# Patient Record
Sex: Female | Born: 1945 | Race: White | Hispanic: No | Marital: Married | Smoking: Heavy tobacco smoker
Health system: Southern US, Community
[De-identification: ages and names within clinical notes are randomized; demographics above are authoritative.]

## PROBLEM LIST (undated history)

## (undated) DIAGNOSIS — E785 Hyperlipidemia, unspecified: Secondary | ICD-10-CM

## (undated) DIAGNOSIS — Z720 Tobacco use: Secondary | ICD-10-CM

## (undated) DIAGNOSIS — M545 Low back pain, unspecified: Secondary | ICD-10-CM

## (undated) DIAGNOSIS — M5136 Other intervertebral disc degeneration, lumbar region: Secondary | ICD-10-CM

## (undated) DIAGNOSIS — S62109A Fracture of unspecified carpal bone, unspecified wrist, initial encounter for closed fracture: Secondary | ICD-10-CM

## (undated) DIAGNOSIS — I10 Essential (primary) hypertension: Secondary | ICD-10-CM

## (undated) DIAGNOSIS — Z8249 Family history of ischemic heart disease and other diseases of the circulatory system: Secondary | ICD-10-CM

## (undated) DIAGNOSIS — D751 Secondary polycythemia: Secondary | ICD-10-CM

## (undated) DIAGNOSIS — Z8639 Personal history of other endocrine, nutritional and metabolic disease: Secondary | ICD-10-CM

## (undated) DIAGNOSIS — E042 Nontoxic multinodular goiter: Secondary | ICD-10-CM

## (undated) DIAGNOSIS — M25512 Pain in left shoulder: Secondary | ICD-10-CM

## (undated) DIAGNOSIS — J449 Chronic obstructive pulmonary disease, unspecified: Secondary | ICD-10-CM

## (undated) DIAGNOSIS — R911 Solitary pulmonary nodule: Secondary | ICD-10-CM

## (undated) DIAGNOSIS — R928 Other abnormal and inconclusive findings on diagnostic imaging of breast: Principal | ICD-10-CM

## (undated) DIAGNOSIS — G8929 Other chronic pain: Secondary | ICD-10-CM

## (undated) DIAGNOSIS — R7301 Impaired fasting glucose: Secondary | ICD-10-CM

## (undated) DIAGNOSIS — M51369 Other intervertebral disc degeneration, lumbar region without mention of lumbar back pain or lower extremity pain: Secondary | ICD-10-CM

## (undated) DIAGNOSIS — M542 Cervicalgia: Secondary | ICD-10-CM

## (undated) DIAGNOSIS — N63 Unspecified lump in unspecified breast: Secondary | ICD-10-CM

## (undated) DIAGNOSIS — M810 Age-related osteoporosis without current pathological fracture: Secondary | ICD-10-CM

## (undated) HISTORY — PX: HEMORRHOID SURGERY: SHX153

## (undated) HISTORY — DX: Hyperlipidemia, unspecified: E78.5

## (undated) HISTORY — DX: Secondary polycythemia: D75.1

## (undated) HISTORY — DX: Unspecified lump in unspecified breast: N63.0

## (undated) HISTORY — DX: Nontoxic multinodular goiter: E04.2

## (undated) HISTORY — DX: Low back pain, unspecified: M54.50

## (undated) HISTORY — DX: Essential (primary) hypertension: I10

## (undated) HISTORY — DX: Cervicalgia: M54.2

## (undated) HISTORY — PX: ABDOMINAL HYSTERECTOMY: SHX81

## (undated) HISTORY — DX: Other chronic pain: G89.29

## (undated) HISTORY — DX: Family history of ischemic heart disease and other diseases of the circulatory system: Z82.49

## (undated) HISTORY — DX: Impaired fasting glucose: R73.01

## (undated) HISTORY — DX: Solitary pulmonary nodule: R91.1

## (undated) HISTORY — DX: Pain in left shoulder: M25.512

## (undated) HISTORY — DX: Other intervertebral disc degeneration, lumbar region without mention of lumbar back pain or lower extremity pain: M51.369

## (undated) HISTORY — DX: Low back pain: M54.5

## (undated) HISTORY — DX: Fracture of unspecified carpal bone, unspecified wrist, initial encounter for closed fracture: S62.109A

## (undated) HISTORY — DX: Personal history of other endocrine, nutritional and metabolic disease: Z86.39

## (undated) HISTORY — DX: Chronic obstructive pulmonary disease, unspecified: J44.9

## (undated) HISTORY — DX: Other intervertebral disc degeneration, lumbar region: M51.36

---

## 1997-12-29 ENCOUNTER — Emergency Department (HOSPITAL_COMMUNITY): Admission: EM | Admit: 1997-12-29 | Discharge: 1997-12-29 | Payer: Self-pay | Admitting: Emergency Medicine

## 1997-12-29 ENCOUNTER — Encounter: Payer: Self-pay | Admitting: Emergency Medicine

## 2004-11-19 ENCOUNTER — Ambulatory Visit (HOSPITAL_COMMUNITY): Admission: RE | Admit: 2004-11-19 | Discharge: 2004-11-19 | Payer: Self-pay | Admitting: Emergency Medicine

## 2004-12-04 ENCOUNTER — Ambulatory Visit (HOSPITAL_COMMUNITY): Admission: RE | Admit: 2004-12-04 | Discharge: 2004-12-04 | Payer: Self-pay | Admitting: Emergency Medicine

## 2004-12-08 ENCOUNTER — Ambulatory Visit (HOSPITAL_COMMUNITY): Admission: RE | Admit: 2004-12-08 | Discharge: 2004-12-08 | Payer: Self-pay | Admitting: Emergency Medicine

## 2006-02-09 DIAGNOSIS — N63 Unspecified lump in unspecified breast: Secondary | ICD-10-CM

## 2006-02-09 HISTORY — DX: Unspecified lump in unspecified breast: N63.0

## 2006-05-25 ENCOUNTER — Ambulatory Visit (HOSPITAL_COMMUNITY): Admission: RE | Admit: 2006-05-25 | Discharge: 2006-05-25 | Payer: Self-pay | Admitting: Internal Medicine

## 2006-06-23 ENCOUNTER — Ambulatory Visit (HOSPITAL_COMMUNITY): Admission: RE | Admit: 2006-06-23 | Discharge: 2006-06-23 | Payer: Self-pay

## 2007-01-19 ENCOUNTER — Ambulatory Visit (HOSPITAL_COMMUNITY): Admission: RE | Admit: 2007-01-19 | Discharge: 2007-01-19 | Payer: Self-pay | Admitting: Internal Medicine

## 2008-01-26 ENCOUNTER — Ambulatory Visit (HOSPITAL_COMMUNITY): Admission: RE | Admit: 2008-01-26 | Discharge: 2008-01-26 | Payer: Self-pay | Admitting: Internal Medicine

## 2008-02-08 ENCOUNTER — Ambulatory Visit (HOSPITAL_COMMUNITY): Admission: RE | Admit: 2008-02-08 | Discharge: 2008-02-08 | Payer: Self-pay | Admitting: Internal Medicine

## 2009-01-29 ENCOUNTER — Ambulatory Visit (HOSPITAL_COMMUNITY): Admission: RE | Admit: 2009-01-29 | Discharge: 2009-01-29 | Payer: Self-pay | Admitting: Internal Medicine

## 2009-03-28 ENCOUNTER — Emergency Department (HOSPITAL_COMMUNITY): Admission: EM | Admit: 2009-03-28 | Discharge: 2009-03-28 | Payer: Self-pay | Admitting: Emergency Medicine

## 2010-02-09 DIAGNOSIS — S62109A Fracture of unspecified carpal bone, unspecified wrist, initial encounter for closed fracture: Secondary | ICD-10-CM

## 2010-02-09 HISTORY — DX: Fracture of unspecified carpal bone, unspecified wrist, initial encounter for closed fracture: S62.109A

## 2010-05-01 LAB — LIPASE, BLOOD: Lipase: 22 U/L (ref 11–59)

## 2010-05-01 LAB — DIFFERENTIAL
Basophils Relative: 1 % (ref 0–1)
Eosinophils Relative: 1 % (ref 0–5)
Lymphocytes Relative: 25 % (ref 12–46)
Monocytes Relative: 6 % (ref 3–12)
Neutro Abs: 7.7 10*3/uL (ref 1.7–7.7)
Neutrophils Relative %: 68 % (ref 43–77)

## 2010-05-01 LAB — HEPATIC FUNCTION PANEL
ALT: 23 U/L (ref 0–35)
AST: 21 U/L (ref 0–37)
Bilirubin, Direct: 0.1 mg/dL (ref 0.0–0.3)
Total Protein: 7.1 g/dL (ref 6.0–8.3)

## 2010-05-01 LAB — POCT I-STAT, CHEM 8
BUN: 13 mg/dL (ref 6–23)
Chloride: 108 mEq/L (ref 96–112)
Creatinine, Ser: 0.8 mg/dL (ref 0.4–1.2)
HCT: 44 % (ref 36.0–46.0)
Potassium: 3.6 mEq/L (ref 3.5–5.1)
Sodium: 139 mEq/L (ref 135–145)
TCO2: 26 mmol/L (ref 0–100)

## 2010-05-01 LAB — CBC
HCT: 43.2 % (ref 36.0–46.0)
Hemoglobin: 14.9 g/dL (ref 12.0–15.0)
MCHC: 34.5 g/dL (ref 30.0–36.0)
Platelets: 276 10*3/uL (ref 150–400)
RBC: 4.71 MIL/uL (ref 3.87–5.11)

## 2010-05-01 LAB — POCT CARDIAC MARKERS
Myoglobin, poc: 78.5 ng/mL (ref 12–200)
Troponin i, poc: <0.05 ng/mL (ref 0.00–0.09)

## 2010-06-19 ENCOUNTER — Telehealth: Payer: Self-pay | Admitting: Family Medicine

## 2010-06-19 ENCOUNTER — Ambulatory Visit (INDEPENDENT_AMBULATORY_CARE_PROVIDER_SITE_OTHER): Payer: Worker's Compensation | Admitting: Family Medicine

## 2010-06-19 ENCOUNTER — Encounter: Payer: Self-pay | Admitting: Family Medicine

## 2010-06-19 VITALS — BP 143/83 | HR 87 | Temp 98.0°F | Resp 16 | Ht 65.0 in | Wt 177.0 lb

## 2010-06-19 DIAGNOSIS — F17200 Nicotine dependence, unspecified, uncomplicated: Secondary | ICD-10-CM | POA: Insufficient documentation

## 2010-06-19 DIAGNOSIS — E785 Hyperlipidemia, unspecified: Secondary | ICD-10-CM | POA: Insufficient documentation

## 2010-06-19 DIAGNOSIS — J449 Chronic obstructive pulmonary disease, unspecified: Secondary | ICD-10-CM | POA: Insufficient documentation

## 2010-06-19 DIAGNOSIS — I10 Essential (primary) hypertension: Secondary | ICD-10-CM | POA: Insufficient documentation

## 2010-06-19 DIAGNOSIS — S40029A Contusion of unspecified upper arm, initial encounter: Secondary | ICD-10-CM | POA: Insufficient documentation

## 2010-06-19 MED ORDER — OXYCODONE HCL 5 MG PO CAPS: 5.0000 mg | ORAL_CAPSULE | ORAL | Status: AC | PRN

## 2010-06-19 NOTE — Telephone Encounter (Signed)
Pls request old records from Jan Daniels, FNP in [place]. Also from the [facility] in [place], [place].   Thx

## 2010-06-19 NOTE — Assessment & Plan Note (Signed)
Need to r/o radius or carpel bone fx--x-rays ordered today. Placed pt in sling today. Oxycodone 5mg  q4-6h prn pain, #30, no Rf. I encouraged her to come back for appropriate f/u of her chronic medical issues. Will collect old records.

## 2010-06-19 NOTE — Progress Notes (Signed)
Office Note 06/19/2010  CC:  Chief Complaint  Patient presents with  . Arm Pain    fell 5/9,     HPI:  Mckenzie Peters is a 65 y.o. White female who is here to establish care and discuss right arm pain.  Patient's most recent primary MD: Silas Sacramento, FNP in [place].  Also the [facility] in [place], [place]. Old records were reviewed (ones that were already in [facility]) prior to or during today's visit.  Patient presents for acute problem: right arm pain, ongoing/worsening since she slipped and fell off of a chair yesterday when cleaning gutters. Fell onto buttocks, back, and back of head, and hit right arm (?landed on outstretched right arm?).  No longer has any head pain and says back and buttocks are mildly sore, but right arm continues to be 6/10 intensity and hurts worse to move it.  Took one of her husband's vicodin this am and it hasn't helped any.  Soaked in cold water yesterday.  Mild swelling felt by pt in right arm.  No tingling, numbness, or weakness in right arm, wrist, or hand. No history of fracture in the past except clavicle fracture as a child.    Of note, regarding her chronic medical dx's: no meds or o/v's in last 2-3 years due to financial problems/no insurance.  Her income is too much to qualify for the [facility] anymore.  Last saw Silas Sacramento in [place] about a year ago for shingles. She basically takes 1 MVI and some fish oil pills daily.  Past Medical History  Diagnosis Date  . COPD (chronic obstructive pulmonary disease)   . Hypertension   . Hyperlipidemia   . Tobacco dependence 06/19/2010  . Thyroid nodule, cold 2006    Biopsy negative per pt report.  U/s showed multinodular goiter, but TSH nl per pt, thyroid uptake scan nl except cold nodule.  . Breast nodule 2008    Multiple mammos and u/s's done to follow this nonpalpable nodule on right breast: pt reports it eventually resolved.  Screening mammo neg/nl 01/2009.  Marland Kitchen Herpes zoster 2011     Past Surgical History  Procedure Date  . Abdominal hysterectomy approx 1997    Fibroids (ovaries ARE still in)  . Hemorrhoid surgery approx 1982    Family History  Problem Relation Age of Onset  . Cancer Mother 26    Breast.  Died age 32  . Cancer Sister     nephrectomy for cancer  . Cancer Brother     2 brothers died of "cancer"    History   Social History  . Marital Status: Married    Spouse Name: N/A    Number of Children: N/A  . Years of Education: N/A   Occupational History  . Not on file.   Social History Main Topics  . Smoking status: Current Everyday Smoker -- 2.0 packs/day for 30 years    Types: Cigarettes  . Smokeless tobacco: Not on file  . Alcohol Use: Yes     occasional  . Drug Use: Not on file  . Sexually Active: Not on file   Other Topics Concern  . 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  Outpatient Encounter Prescriptions as of 06/19/2010  Medication Sig Dispense Refill  . Multiple Vitamin (MULTIVITAMIN) tablet Take 1 tablet by mouth daily.        . Omega-3 Fatty Acids (FISH OIL) 1200 MG CAPS Take by mouth daily.        Marland Kitchen oxycodone (OXY-IR) 5 MG capsule Take 1 capsule (5 mg total) by mouth every 4 (four) hours as needed for pain.  30 capsule  0  . albuterol-ipratropium (COMBIVENT) 18-103 MCG/ACT inhaler        . lisinopril-hydrochlorothiazide (PRINZIDE,ZESTORETIC) 20-25 MG per tablet Take 1 tablet by mouth daily.        . simvastatin (ZOCOR) 40 MG tablet Take 40 mg by mouth at bedtime.          No Known Allergies  ROS Review of Systems  Constitutional: Negative for fever and fatigue.  HENT: Negative for congestion and  sore throat.   Eyes: Negative for visual disturbance.  Cardiovascular: Negative for chest pain.  Gastrointestinal: Negative for nausea and abdominal pain.  Genitourinary: Negative for dysuria.  Musculoskeletal: Negative for back pain and joint swelling.  Skin: Negative for rash.  Neurological: Negative for weakness and headaches.  Hematological: Negative for adenopathy.     PE; Blood pressure 143/83, pulse 87, temperature 98 F (36.7 C), temperature source Oral, resp. rate 16, height 5\' 5"  (1.651 m), weight 177 lb (80.287 kg), SpO2 94.00%. Gen: Alert, well appearing.  Patient is oriented to person, place, time, and situation. Right arm: no obvious swelling, no erythema, no ecchymoses no deformity.  She can pronate and supinate but slowly and with pain. Elbow and wrist ROM intact.  She has TTP in midshaft of right radius and in distal radius near wrist.  Also TTP in right anatomic snuff box region.  No metacarpal or palmar TTP.  Radial pulse 2+ bilat.  Grip strength normal, proximal right arm strength normal.  Pertinent labs:  none  ASSESSMENT AND PLAN:   Arm contusion Need to r/o radius or carpel bone fx--x-rays ordered today. Placed pt in sling today. Oxycodone 5mg  q4-6h prn pain, #30, no Rf. I encouraged her to come back for appropriate f/u of her chronic medical issues. Will collect old records.     Return if symptoms worsen or fail to improve.

## 2010-06-30 ENCOUNTER — Telehealth: Payer: Self-pay | Admitting: Family Medicine

## 2010-06-30 NOTE — Telephone Encounter (Signed)
I spoke to pt who states she did go to [facility] on [address] as she has filed a Freight forwarder as she states she fell while on work hours.  She is completely out of Oxycodone.   I spoke to [place] at [facility] who states pt was seen there on Friday and is now under their care for her worker's comp claim.  Her xray has not been sent out for over read.  Prelim report says normal.  Pt was not given pain meds at that time.  Pt has a follow up with them on 07/02/10. Should I call pt and advise her that all refills will have to come from them as she is now under their care?

## 2010-06-30 NOTE — Telephone Encounter (Signed)
Advise patient that she will likely have to have these sorts of meds prescribed by the doctor now managing her care for this injury, she is welcome to come in and discuss her ongoing care with her PMD here as soon as tomorrow when he gets back in the office

## 2010-06-30 NOTE — Telephone Encounter (Signed)
[facility], patient is requesting oxycodone refill for pain

## 2010-07-01 NOTE — Telephone Encounter (Signed)
Notified pt of above.  She voices understanding and is agreeable.

## 2010-07-11 ENCOUNTER — Telehealth: Payer: Self-pay | Admitting: Family Medicine

## 2010-07-11 NOTE — Telephone Encounter (Signed)
Left a message with pts spouse to have pt return my call 

## 2010-07-11 NOTE — Telephone Encounter (Signed)
Patient informed. 

## 2010-07-11 NOTE — Telephone Encounter (Signed)
No.  She saw another MD for her arm pain after me (for workers comp purposes per her report), so she needs to continue to get this problem and it's treatment addressed through THAT physician.

## 2010-07-11 NOTE — Telephone Encounter (Signed)
Patient would like Rx for oxycodone, does not want to take Tramadol

## 2010-07-21 NOTE — Telephone Encounter (Signed)
Records have been received.

## 2011-01-15 ENCOUNTER — Encounter: Payer: Self-pay | Admitting: Family Medicine

## 2011-01-15 ENCOUNTER — Ambulatory Visit (INDEPENDENT_AMBULATORY_CARE_PROVIDER_SITE_OTHER): Payer: Self-pay | Admitting: Family Medicine

## 2011-01-15 DIAGNOSIS — E049 Nontoxic goiter, unspecified: Secondary | ICD-10-CM

## 2011-01-15 DIAGNOSIS — F17200 Nicotine dependence, unspecified, uncomplicated: Secondary | ICD-10-CM

## 2011-01-15 DIAGNOSIS — I10 Essential (primary) hypertension: Secondary | ICD-10-CM

## 2011-01-15 LAB — BASIC METABOLIC PANEL
BUN: 11 mg/dL (ref 6–23)
CO2: 25 mEq/L (ref 19–32)
Calcium: 9.7 mg/dL (ref 8.4–10.5)
Chloride: 107 mEq/L (ref 96–112)
Creat: 0.8 mg/dL (ref 0.50–1.10)
Potassium: 3.8 mEq/L (ref 3.5–5.3)

## 2011-01-15 MED ORDER — LISINOPRIL-HYDROCHLOROTHIAZIDE 20-25 MG PO TABS: 1.0000 | ORAL_TABLET | Freq: Every day | ORAL | Status: DC

## 2011-01-15 NOTE — Progress Notes (Signed)
OFFICE NOTE  01/15/2011  CC:  Chief Complaint  Patient presents with  . Hypertension     HPI:   Patient is a 65 y.o. Caucasian female who is here for elevated bp. She reinjured her arm recently (hx of fracture) with repetitive lifting of her wheelchair-bound husband, went to [facility] on [address] yesterday for recheck and they x-rayed it (no new problem) and referred her back to her orthopedist.  She is in pain  (6/7 intensity) with this. Her bp there yesterday was 160/100 and she felt a bit woozy, slight HA since then.  Mild vision blurring.  No focal weakness, dysarthria, or swallowing problems.  NO recent URI or fever.  Has chronic smoker's cough---unchanged. Has been off bp med for a long time due to having no insurance, financial problems, focuses on care of husband more than herself. Does not monitor bp at home except last night it was 168/108.   Denies CP, nausea, arm pain, diaphoresis, palpitations, or leg swelling. No OTC decongestant meds. She does drink about 1 pot of coffee per day and smokes 2 packs of cigarettes daily.  Pertinent PMH:  Past Medical History  Diagnosis Date  . COPD (chronic obstructive pulmonary disease)   . Hypertension   . Hyperlipidemia   . Tobacco dependence 06/19/2010  . Thyroid nodule, cold 2006    Biopsy negative per pt report.  U/s showed multinodular goiter, but TSH nl per pt, thyroid uptake scan nl except cold nodule.  . Breast nodule 2008    Multiple mammos and u/s's done to follow this nonpalpable nodule on right breast: pt reports it eventually resolved.  Screening mammo neg/nl 01/2009.  Marland Kitchen Herpes zoster 2011   Past surgical, family, and social history reviewed and there are no changes since the patient's last office visit with me.  MEDS;  Currently taking only MVI qd, fish oil cap daily, and vit B12 tab daily.  Outpatient Prescriptions Prior to Visit  Medication Sig Dispense Refill  . Multiple Vitamin (MULTIVITAMIN) tablet  Take 1 tablet by mouth daily.        . Omega-3 Fatty Acids (FISH OIL) 1200 MG CAPS Take by mouth daily.        Marland Kitchen albuterol-ipratropium (COMBIVENT) 18-103 MCG/ACT inhaler        . lisinopril-hydrochlorothiazide (PRINZIDE,ZESTORETIC) 20-25 MG per tablet Take 1 tablet by mouth daily.        . simvastatin (ZOCOR) 40 MG tablet Take 40 mg by mouth at bedtime.          PE: Blood pressure 146/92, pulse 92, temperature 97.8 F (36.6 C), temperature source Oral, height 5\' 5"  (1.651 m), weight 173 lb (78.472 kg), SpO2 95.00%. Gen: Alert, well appearing.  Patient is oriented to person, place, time, and situation. NECK: thyroid gland diffusely enlarged, without tenderness or focal nodularity.   Superior to this (overlying crycoid cartilage region) is a soft tissue mass that is soft, rubbery, moveable, directly in midline--mild discomfort with palpation.  No erythema, no warmth, no fluctuance.  There is some normal subQ fat palpable under loosened neck skin here but definitely a palpable soft tissue mass/focal fullness as described. Neuro: CN 2-12 intact bilaterally, strength 5/5 in proximal and distal upper extremities and lower extremities bilaterally.  No sensory deficits.  No tremor.  No disdiadochokinesis.  No ataxia.  Upper extremity and lower extremity DTRs symmetric.  No pronator drift. CV: RRR, no m/r/g.   LUNGS: CTA bilat, nonlabored resps, good aeration in all lung fields.  EXT: no clubbing, cyanosis, or edema.    IMPRESSION AND PLAN:  HTN (hypertension), benign Restart lisinopril/hctz 20/25, 1 qd, #30, RF x 1. Recheck bp daily, goal bp 130/80 or less, return for office recheck in 14mo.  Tobacco dependence Encouraged tobacco cessation but she is not even considering giving up this vice right now.   Thyroid goiter: cold nodule in the past--neg bx. Question of soft tissue neck mass cranial to the thyroid gland region in the midline--pt feels like it is gradually enlarging, has been there for  years. We'll pick up with this problem at our f/u in 14mo.  Checking BMET and TSH today.  FOLLOW UP:  No Follow-up on file.

## 2011-01-15 NOTE — Assessment & Plan Note (Signed)
Restart lisinopril/hctz 20/25, 1 qd, #30, RF x 1. Recheck bp daily, goal bp 130/80 or less, return for office recheck in 37mo.

## 2011-01-15 NOTE — Assessment & Plan Note (Signed)
Encouraged tobacco cessation but she is not even considering giving up this vice right now.

## 2011-01-15 NOTE — Patient Instructions (Signed)
Vitamin K and Warfarin Warfarin is a drug that helps thin your blood. If you take warfarin, you will need to follow a diet that has a consistent amount of vitamin K-containing foods. Sudden changes in the amount of vitamin K that you eat can cause the medicine to not work as well as it should. You do not need to avoid vitamin K-containing foods. FOODS HIGH IN VITAMIN K  Broccoli, fresh or frozen, cooked, 1 cup     Greens, fresh or frozen, cooked (beet, collard, mustard, turnip),  cup     Kale, fresh or frozen, cooked,  cup     Parsley, raw, 10 sprigs     Spinach, frozen or canned, cooked,  cup  FOODS MODERATELY HIGH IN VITAMIN K  Bok choy, cooked, 1 cup     Broccoli, raw, 1 cup     Brussels sprouts, fresh or frozen, cooked,  cup     Cabbage, cooked, 1 cup     Endive, raw, 1 cup     Green leaf lettuce, raw, 1 cup     Green scallions, raw,  cup     Okra, frozen, cooked, 1 cup     Romaine lettuce, raw, 1 cup     Sauerkraut, canned, 1 cup     Spinach, raw, 1 cup  KEEPING YOUR INTAKE CONSISTENT  Note the foods that are high in vitamin K listed above. How many times per week do you eat these foods?     For example, you mighteat cooked broccoli 1 time a week and a leafy green salad 3 times a week. In that case, you should have 1 high vitamin K food each week and 3 moderately high foods each week.     Remember, you do not need to eat the same foods each week. Try to keep your vitamin K intake levels about the same.  MORE TIPS  Take your warfarin as instructed.     It is okay to take a multivitamin that contains vitamin K. Just be sure to take it every day.     Avoid taking herbs unless approved by your caregiver.     Discuss any supplements or whole food supplements with your pharmacist.     If you drink alcohol, drink only in moderation while taking warfarin. If approved by your caregiver, have only 1 to 2 drinks per day. One drink = 5 oz of wine, 12 oz of beer, or  1  oz of hard liquor.  Document Released: 11/23/2008 Document Revised: 10/08/2010 Document Reviewed: 11/23/2008 ExitCare Patient Information 2012 [employer]. 

## 2011-01-15 NOTE — Progress Notes (Signed)
Addended by: Baldemar Lenis R on: 01/15/2011 02:33 PM   Modules accepted: Orders

## 2011-01-22 ENCOUNTER — Other Ambulatory Visit: Payer: Self-pay | Admitting: Family Medicine

## 2011-01-22 ENCOUNTER — Telehealth: Payer: Self-pay | Admitting: *Deleted

## 2011-01-22 DIAGNOSIS — I10 Essential (primary) hypertension: Secondary | ICD-10-CM

## 2011-01-22 NOTE — Telephone Encounter (Signed)
Pt calls stating that she has had back and leg pain/cramping for 2 days.  She would like to know if this could be related to HCTZ.  She has no UTI symptoms.  No other body pains.  She is also taking celebrex and wonders if that could effect anything.  Please adivse.

## 2011-01-22 NOTE — Telephone Encounter (Signed)
Could be related to HCTZ use.  Not likely from celebrex. I recommend she come by the lab for a BMET ASAP (Dx 401.1). Thx

## 2011-01-22 NOTE — Telephone Encounter (Signed)
Left message on machine to return my call. 

## 2011-01-22 NOTE — Telephone Encounter (Signed)
Notified pt. She states she cannot have labs until Monday as her husband has physical therapy tomorrow.

## 2011-01-27 ENCOUNTER — Telehealth: Payer: Self-pay | Admitting: Family Medicine

## 2011-01-27 MED ORDER — AMLODIPINE BESY-BENAZEPRIL HCL 5-20 MG PO CAPS: 1.0000 | ORAL_CAPSULE | Freq: Every day | ORAL | Status: DC

## 2011-01-27 NOTE — Telephone Encounter (Signed)
Pt is eating bananas for potassium and leg pain is better.  Pt would like different med so she doesn't have to eat so many bananas.  Pt can not afford more lab work at this time.  Please advise.

## 2011-01-27 NOTE — Telephone Encounter (Signed)
Patient informed. 

## 2011-01-27 NOTE — Telephone Encounter (Signed)
OK.  D/C current bp med. I sent new rx to [facility] (generic Lotrel: this should not cause low potassium)--PM

## 2011-02-27 ENCOUNTER — Ambulatory Visit: Payer: Self-pay | Admitting: Family Medicine

## 2011-05-26 ENCOUNTER — Telehealth: Payer: Self-pay | Admitting: *Deleted

## 2011-05-26 MED ORDER — DIAZEPAM 10 MG PO TABS: 10.0000 mg | ORAL_TABLET | Freq: Two times a day (BID) | ORAL | Status: AC | PRN

## 2011-05-26 NOTE — Telephone Encounter (Signed)
May call in diazepam 10mg , 1 tab po q12h prn anxiety, #30, no RF.

## 2011-05-26 NOTE — Telephone Encounter (Signed)
Pt calls this AM stating that her husband is on life support following surgery on 05/21/11.  She would like to know if we can call to pharmacy something for her nerves without her being seen.  Pt does admit to taking one of husband's diazepam (which helped symptoms), but does not want to deplete his meds in case he comes home.  Please advise.

## 2011-05-26 NOTE — Telephone Encounter (Signed)
RX faxed

## 2011-07-27 ENCOUNTER — Telehealth: Payer: Self-pay | Admitting: Family Medicine

## 2011-07-27 NOTE — Telephone Encounter (Signed)
Pt is calling for husband.  Follow up ended.

## 2011-10-26 ENCOUNTER — Other Ambulatory Visit: Payer: Self-pay | Admitting: *Deleted

## 2011-10-26 MED ORDER — AMLODIPINE BESY-BENAZEPRIL HCL 5-20 MG PO CAPS: 1.0000 | ORAL_CAPSULE | Freq: Every day | ORAL | Status: DC

## 2011-10-26 NOTE — Telephone Encounter (Signed)
Faxed refill request received from pharmacy for LOTREL Last filled by MD on 01/27/11, #30 X 3 Last seen on 01/15/11 Follow up needed in one month, pt has been taking care of husband.   PC to pt.  Advised I will send in one month supply, but she will need OV prior to any more refills.  She is getting in-home aide for husband and should be able to leave him at home with aide by the time these meds run out.  She will call back to make appt. 30 day supply sent.

## 2012-05-24 ENCOUNTER — Telehealth: Payer: Self-pay | Admitting: *Deleted

## 2012-05-24 NOTE — Telephone Encounter (Signed)
Pt has poison ivy that is getting worse.  She has tried Chief Technology Officer calamine.  Verbal per Dr. Milinda Cave, pt can try oatmeal bath, OTC hydrocortisone cream, and antihistamine such as allegra or zyrtec.  Pt is agreeable.  Advised as long as she has washed the resin off her body well, she will not expose husband.  Advised if she does not get better, she will need office visit.  She is agreeable.

## 2012-05-25 NOTE — Telephone Encounter (Signed)
Agree.-PM

## 2012-11-25 ENCOUNTER — Telehealth: Payer: Self-pay | Admitting: Family Medicine

## 2012-11-25 NOTE — Telephone Encounter (Signed)
Please advise refill? 

## 2012-11-25 NOTE — Telephone Encounter (Signed)
Patient has been getting refills from her [facility] provider however she wants to come back to our office since she now has Medicare.

## 2012-11-25 NOTE — Telephone Encounter (Signed)
Can patient get enough medication to make it to Monday 11/28/12 appt?

## 2012-11-25 NOTE — Telephone Encounter (Signed)
All medications in current med list are OTC.

## 2012-11-28 ENCOUNTER — Ambulatory Visit (INDEPENDENT_AMBULATORY_CARE_PROVIDER_SITE_OTHER): Payer: Medicare Other | Admitting: Family Medicine

## 2012-11-28 ENCOUNTER — Encounter (INDEPENDENT_AMBULATORY_CARE_PROVIDER_SITE_OTHER): Payer: Self-pay

## 2012-11-28 ENCOUNTER — Encounter: Payer: Self-pay | Admitting: Family Medicine

## 2012-11-28 VITALS — BP 124/87 | HR 80 | Temp 98.4°F | Resp 20 | Ht 65.0 in | Wt 170.0 lb

## 2012-11-28 DIAGNOSIS — Z23 Encounter for immunization: Secondary | ICD-10-CM | POA: Diagnosis not present

## 2012-11-28 DIAGNOSIS — G4700 Insomnia, unspecified: Secondary | ICD-10-CM | POA: Insufficient documentation

## 2012-11-28 DIAGNOSIS — M5030 Other cervical disc degeneration, unspecified cervical region: Secondary | ICD-10-CM

## 2012-11-28 DIAGNOSIS — M545 Low back pain: Secondary | ICD-10-CM

## 2012-11-28 DIAGNOSIS — I10 Essential (primary) hypertension: Secondary | ICD-10-CM

## 2012-11-28 DIAGNOSIS — G8929 Other chronic pain: Secondary | ICD-10-CM | POA: Insufficient documentation

## 2012-11-28 DIAGNOSIS — M542 Cervicalgia: Secondary | ICD-10-CM

## 2012-11-28 DIAGNOSIS — F17200 Nicotine dependence, unspecified, uncomplicated: Secondary | ICD-10-CM

## 2012-11-28 LAB — COMPREHENSIVE METABOLIC PANEL
ALT: 33 U/L (ref 0–35)
AST: 24 U/L (ref 0–37)
Albumin: 3.9 g/dL (ref 3.5–5.2)
Alkaline Phosphatase: 97 U/L (ref 39–117)
BUN: 10 mg/dL (ref 6–23)
Calcium: 9.4 mg/dL (ref 8.4–10.5)
Chloride: 102 mEq/L (ref 96–112)
Creatinine, Ser: 0.8 mg/dL (ref 0.4–1.2)
Glucose, Bld: 108 mg/dL — ABNORMAL HIGH (ref 70–99)
Potassium: 4.2 mEq/L (ref 3.5–5.1)
Total Bilirubin: 0.6 mg/dL (ref 0.3–1.2)

## 2012-11-28 MED ORDER — TRAZODONE HCL 50 MG PO TABS: 50.0000 mg | ORAL_TABLET | Freq: Every day | ORAL | Status: DC

## 2012-11-28 MED ORDER — HYDROCHLOROTHIAZIDE 25 MG PO TABS: 25.0000 mg | ORAL_TABLET | Freq: Every day | ORAL | Status: DC

## 2012-11-28 MED ORDER — DIAZEPAM 10 MG PO TABS: 10.0000 mg | ORAL_TABLET | Freq: Every evening | ORAL | Status: DC | PRN

## 2012-11-28 MED ORDER — MELOXICAM 15 MG PO TABS: 15.0000 mg | ORAL_TABLET | Freq: Every day | ORAL | Status: DC

## 2012-11-28 MED ORDER — HYDROCODONE-ACETAMINOPHEN 10-325 MG PO TABS: ORAL_TABLET | ORAL | Status: DC

## 2012-11-28 NOTE — Assessment & Plan Note (Signed)
Successfully quit smoking cigarettes but I encouraged pt to ween herself off of the e-cigs.

## 2012-11-28 NOTE — Progress Notes (Signed)
OFFICE NOTE  11/28/2012  CC:  Chief Complaint  Patient presents with  . Back Pain  . Medication Refill     HPI: Patient is a 67 y.o. Caucasian female who is here to re-establish care with me; coming in for back and neck pain, desires medication refill.  Most recently has been seeing a FNP with [facility] in [facility]).  Also has been seeing someone at [facility] since I saw her last (workman's comp).  Hx of back pain after an accident at work 06/2010.  Plain films showed disc space narrowing per her report.  She initially got better/pain resolved but it returned about 1 yr ago. She was rx'd meloxicam, trial of prednisone, and tramadol.  Says none of it helps.  Also rx'd valium as a muscle relaxer and anxiety med: 5mg  bid.  Says Tylenol #3 and vicodin and she says vicodin helped. Due to insurance/financial problems she has been out of some of her meds for a few months.  Has been taking HCTZ and valium still, plus she apparently has made her tramadol stretch/last for a while b/c she is not out of this med.  Back pain: onset about 11 mo ago when helping her husband with bedpan use/chronic home care, described as "unbearable", low back, right hip, down both buttocks regions to back of thighs but no further, also tingling and numbness in legs--all the way down at times but pt sketchy about the details of this.  Also occ/intermittent lower legs weak.  She was rx'd meloxicam, trial of prednisone, and tramadol.  Says none of it helps.  Also rx'd valium as a muscle relaxer and anxiety med: 5mg  bid.  At one point was rx'd Tylenol #3 and vicodin and she says vicodin helped.  Also, she took one of her husband's oxycodone tabs in the distant past and says it helped the pain everywhere and she could function (with one pill all day long).  PT was recommended and she did "the one visit medicaid allows"--[facility] in [place]. No referral was made to a specialist.  MRI was discussed but pt could  not afford it.  Most recently, she has taken tramadol (2 tabs this morning) and one oxycodone she got from her sister yesterday.  Also takes THREE 5mg  valium qhs--most recently last night.  Neck hurts constantly x 2 yrs--was told an x-ray showed "no cartilage".  Course of treatment was "nothing" b/c workman's comp denied her claim.  Tobacco update: quit cigs 2 yrs ago and is still using e-cigs.  No home bps to report.  Pertinent PMH:  Past Medical History  Diagnosis Date  . COPD (chronic obstructive pulmonary disease)   . Hypertension   . Hyperlipidemia   . Tobacco dependence 06/19/2010  . Thyroid nodule, cold 2006    Biopsy negative per pt report.  U/s showed multinodular goiter, but TSH nl per pt, thyroid uptake scan nl except cold nodule.  . Breast nodule 2008    Multiple mammos and u/s's done to follow this nonpalpable nodule on right breast: pt reports it eventually resolved.  Screening mammo neg/nl 01/2009.  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   Takes care of wheelchair-bound husband, has a dog in the home.   No exercise.   Long smoking history--not contemplating quitting as of 2012.   Financial problems have resulted in fractured/sporadic care  for her chronic medical problems.   ROS: no HA, no CP, no SOB, no palpitations, no tremors, no vision or hearing complaints, no abd pains, no melena or hematochezia.  MEDS:  HCTZ 25mg  qd, tramadol 50 mg, diazepam 5mg  tabs (3 qhs per pt), meloxicam 15mg  qd (not taking).  PE: Blood pressure 124/87, pulse 80, temperature 98.4 F (36.9 C), temperature source Temporal, resp. rate 20, height 5\' 5"  (1.651 m), weight 170 lb (77.111 kg), SpO2 96.00%. Gen: Alert, appears older than stated age, in NAD.  Patient is oriented to person, place, time, and situation. AFFECT: pleasant, lucid thought and speech. CV: RRR, no m/r/g.    LUNGS: CTA bilat, nonlabored resps, good aeration in all lung fields. Neuro: CN 2-12 intact bilaterally, strength 5/5 in proximal and distal upper extremities and lower extremities bilaterally.  No sensory deficits.  No tremor.  No disdiadochokinesis.  No ataxia.  No pronator drift. Neck: diffuse mild TTP in cervical spine and bilat trapezius areas.  Neck ROM intact, mild discomfort with this.  No UE radicular sx's with neck ROM or spurling's maneuver.   Back: lumbar ROM intact with minimal pain/stiffness/discomfort.  Sitting SLR neg--tight hamstrings only.  LAB: none today  IMPRESSION AND PLAN:  Mechanical low back pain Obtain old records/imaging. Refer to PT--[facility] PT order placed today. Discussed restarting meloxicam 15mg  qd and SHORT term vicodin 10/325, 1/2-1 tab q6h prn, #60, no RF. UDS today. If patient's pain appears to require chronic narcotic treatment over the next few months then I will do further imaging and/or refer her to pain specialist.   Chronic neck pain Combo of DDD and myofascial pain.  No sign of radiculopathy. Obtain old records and imaging. PT referral ordered. Meloxicam and vicodin as per "mechanical low back pain" problem above.  HTN (hypertension), benign BP good here today. Need to discuss home monitoring more at future visits. HCTZ refilled.  CMET today.  Insomnia Anxiety/stress related. I recommended she take a max of 10mg  valium at bedtime, so this is what I wrote for her rx for this med today--see orders. Controlled substance contract reviewed with patient today.  Patient signed this and it will be placed in the chart.   UDS today.   Tobacco dependence Successfully quit smoking cigarettes but I encouraged pt to ween herself off of the e-cigs.  Flu vaccine IM today.  Needs pneumovax next f/u visit.  An After Visit Summary was printed and given to the patient.  FOLLOW UP: 3 wks, f/u pain and also f/u her anterior neck mass.

## 2012-11-28 NOTE — Assessment & Plan Note (Addendum)
BP good here today. Need to discuss home monitoring more at future visits. HCTZ refilled.  CMET today.

## 2012-11-28 NOTE — Assessment & Plan Note (Signed)
Combo of DDD and myofascial pain.  No sign of radiculopathy. Obtain old records and imaging. PT referral ordered. Meloxicam and vicodin as per "mechanical low back pain" problem above.

## 2012-11-28 NOTE — Assessment & Plan Note (Signed)
Anxiety/stress related. I recommended she take a max of 10mg  valium at bedtime, so this is what I wrote for her rx for this med today--see orders. Controlled substance contract reviewed with patient today.  Patient signed this and it will be placed in the chart.   UDS today.

## 2012-11-28 NOTE — Assessment & Plan Note (Signed)
Obtain old records/imaging. Refer to PT--[facility] PT order placed today. Discussed restarting meloxicam 15mg  qd and SHORT term vicodin 10/325, 1/2-1 tab q6h prn, #60, no RF. UDS today. If patient's pain appears to require chronic narcotic treatment over the next few months then I will do further imaging and/or refer her to pain specialist.

## 2012-12-02 DIAGNOSIS — M542 Cervicalgia: Secondary | ICD-10-CM | POA: Diagnosis not present

## 2012-12-02 DIAGNOSIS — IMO0001 Reserved for inherently not codable concepts without codable children: Secondary | ICD-10-CM | POA: Diagnosis not present

## 2012-12-02 DIAGNOSIS — M545 Low back pain: Secondary | ICD-10-CM | POA: Diagnosis not present

## 2012-12-10 LAB — HM MAMMOGRAPHY

## 2012-12-21 ENCOUNTER — Encounter: Payer: Self-pay | Admitting: Family Medicine

## 2012-12-21 ENCOUNTER — Ambulatory Visit (INDEPENDENT_AMBULATORY_CARE_PROVIDER_SITE_OTHER): Payer: Medicare Other | Admitting: Family Medicine

## 2012-12-21 VITALS — BP 134/85 | HR 80 | Temp 97.6°F | Resp 18 | Ht 65.0 in | Wt 172.0 lb

## 2012-12-21 DIAGNOSIS — I10 Essential (primary) hypertension: Secondary | ICD-10-CM | POA: Diagnosis not present

## 2012-12-21 DIAGNOSIS — G8929 Other chronic pain: Secondary | ICD-10-CM | POA: Diagnosis not present

## 2012-12-21 DIAGNOSIS — M545 Low back pain: Secondary | ICD-10-CM | POA: Diagnosis not present

## 2012-12-21 DIAGNOSIS — M542 Cervicalgia: Secondary | ICD-10-CM

## 2012-12-21 MED ORDER — OXYCODONE HCL 15 MG PO TABS: ORAL_TABLET | ORAL | Status: DC

## 2012-12-21 NOTE — Progress Notes (Signed)
OFFICE NOTE  12/21/2012  CC:  Chief Complaint  Patient presents with  . Follow-up     HPI: Patient is a 67 y.o. Caucasian female who is here for f/u chronic back and neck pain. Pain same, restarted meloxicam and vicodin but doesn't start PT until this coming Monday. She says she took her brother's 15mg  oxycodone and was able to function all day on only one pill. She's taking bp med. Takes valium prn.  Pertinent PMH:  Past Medical History  Diagnosis Date  . COPD (chronic obstructive pulmonary disease)   . Hypertension   . Hyperlipidemia   . 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 Thyroid nodule, cold 2006    Biopsy negative per pt report.  U/s showed multinodular goiter, but TSH nl per pt, thyroid uptake scan nl except cold nodule.  . Breast nodule 2008    Multiple mammos and u/s's done to follow this nonpalpable nodule on right breast: pt reports it eventually resolved.  Screening mammo neg/nl 01/2009.  Marland Kitchen Herpes zoster 2011  . Wrist fracture, closed 2012    right  . Neck mass 2006  . Chronic low back pain   . Chronic neck pain     MEDS:  Outpatient Prescriptions Prior to Visit  Medication Sig Dispense Refill  . diazepam (VALIUM) 10 MG tablet Take 1 tablet (10 mg total) by mouth at bedtime as needed for anxiety.  30 tablet  0  . hydrochlorothiazide (HYDRODIURIL) 25 MG tablet Take 1 tablet (25 mg total) by mouth daily.  30 tablet  6  . HYDROcodone-acetaminophen (NORCO) 10-325 MG per tablet 1/2-1 tab po q6h prn pain  60 tablet  0  . meloxicam (MOBIC) 15 MG tablet Take 1 tablet (15 mg total) by mouth daily.  30 tablet  6  . Multiple Vitamin (MULTIVITAMIN) tablet Take 1 tablet by mouth daily.        . Omega-3 Fatty Acids (FISH OIL) 1200 MG CAPS Take by mouth daily.        . Vitamin D, Ergocalciferol, (DRISDOL) 50000 UNITS CAPS capsule Take 50,000 Units by mouth every 7 (seven) days.      Marland Kitchen amLODipine-benazepril (LOTREL) 5-20 MG per capsule Take 1 capsule  by mouth daily.  30 capsule  0   No facility-administered medications prior to visit.    PE: Blood pressure 134/85, pulse 80, temperature 97.6 F (36.4 C), temperature source Temporal, resp. rate 18, height 5\' 5"  (1.651 m), weight 172 lb (78.019 kg), SpO2 97.00%. Gen: Alert, well appearing.  Patient is oriented to person, place, time, and situation. AFFECT: pleasant, lucid thought and speech. No further exam today.   IMPRESSION AND PLAN:  1) Chronic neck and back pain. Will do trial of oxycodone 15mg , 1 bid prn, #45. May stop meloxicam.  2) HTN.  The current medical regimen is effective;  continue present plan and medications.   FOLLOW UP: 1 mo

## 2012-12-26 DIAGNOSIS — IMO0001 Reserved for inherently not codable concepts without codable children: Secondary | ICD-10-CM | POA: Diagnosis not present

## 2012-12-26 DIAGNOSIS — M542 Cervicalgia: Secondary | ICD-10-CM | POA: Diagnosis not present

## 2012-12-26 DIAGNOSIS — M545 Low back pain: Secondary | ICD-10-CM | POA: Diagnosis not present

## 2012-12-29 ENCOUNTER — Telehealth: Payer: Self-pay | Admitting: Family Medicine

## 2012-12-29 DIAGNOSIS — IMO0001 Reserved for inherently not codable concepts without codable children: Secondary | ICD-10-CM | POA: Diagnosis not present

## 2012-12-29 DIAGNOSIS — M542 Cervicalgia: Secondary | ICD-10-CM | POA: Diagnosis not present

## 2012-12-29 DIAGNOSIS — M545 Low back pain: Secondary | ICD-10-CM | POA: Diagnosis not present

## 2012-12-29 MED ORDER — DIAZEPAM 10 MG PO TABS: 10.0000 mg | ORAL_TABLET | Freq: Every evening | ORAL | Status: DC | PRN

## 2012-12-29 NOTE — Telephone Encounter (Signed)
Diazepam rx printed.

## 2012-12-29 NOTE — Telephone Encounter (Signed)
Patient requesting refill of diazepam.  Patient last seen 12/21/12.  Rx last printed 11/28/12.  Please advise refill.

## 2012-12-30 NOTE — Telephone Encounter (Signed)
Faxed to pharmacy. Patient aware.  

## 2013-01-09 ENCOUNTER — Encounter: Payer: Self-pay | Admitting: Family Medicine

## 2013-01-11 DIAGNOSIS — M545 Low back pain: Secondary | ICD-10-CM | POA: Diagnosis not present

## 2013-01-11 DIAGNOSIS — IMO0001 Reserved for inherently not codable concepts without codable children: Secondary | ICD-10-CM | POA: Diagnosis not present

## 2013-01-11 DIAGNOSIS — M542 Cervicalgia: Secondary | ICD-10-CM | POA: Diagnosis not present

## 2013-01-13 DIAGNOSIS — IMO0001 Reserved for inherently not codable concepts without codable children: Secondary | ICD-10-CM | POA: Diagnosis not present

## 2013-01-13 DIAGNOSIS — M542 Cervicalgia: Secondary | ICD-10-CM | POA: Diagnosis not present

## 2013-01-13 DIAGNOSIS — M545 Low back pain: Secondary | ICD-10-CM | POA: Diagnosis not present

## 2013-01-18 ENCOUNTER — Ambulatory Visit (INDEPENDENT_AMBULATORY_CARE_PROVIDER_SITE_OTHER): Payer: Medicare Other | Admitting: Family Medicine

## 2013-01-18 ENCOUNTER — Encounter: Payer: Self-pay | Admitting: Family Medicine

## 2013-01-18 VITALS — BP 129/74 | HR 75 | Temp 98.2°F | Resp 18 | Ht 65.0 in | Wt 176.0 lb

## 2013-01-18 DIAGNOSIS — M542 Cervicalgia: Secondary | ICD-10-CM | POA: Diagnosis not present

## 2013-01-18 DIAGNOSIS — M545 Low back pain: Secondary | ICD-10-CM | POA: Diagnosis not present

## 2013-01-18 DIAGNOSIS — Z23 Encounter for immunization: Secondary | ICD-10-CM

## 2013-01-18 DIAGNOSIS — G8929 Other chronic pain: Secondary | ICD-10-CM

## 2013-01-18 MED ORDER — OXYCODONE HCL 15 MG PO TABS: ORAL_TABLET | ORAL | Status: DC

## 2013-01-18 NOTE — Progress Notes (Signed)
OFFICE NOTE  01/18/2013  CC:  Chief Complaint  Patient presents with  . Follow-up     HPI: Patient is a 67 y.o. Caucasian female who is here for 1 mo f/u chronic neck and back pain. Pain well controlled and allows her to function taking one 15mg  oxycodone tab every morning and sometimes a 1/2 tab in evenings. Physical therapy (6 sessions) is helping her low back but not helping her shoulders or neck.  Says she takes valium only when in a lot of stress, not regularly.   Pertinent PMH:  Past Medical History  Diagnosis Date  . COPD (chronic obstructive pulmonary disease)   . Hypertension   . Hyperlipidemia   . 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 Thyroid nodule, cold 2006    Biopsy negative per pt report.  U/s showed multinodular goiter, but TSH nl per pt, thyroid uptake scan nl except cold nodule.  . Breast nodule 2008    Multiple mammos and u/s's done to follow this nonpalpable nodule on right breast: pt reports it eventually resolved.  Screening mammo neg/nl 01/2009.  Marland Kitchen Herpes zoster 2011  . Wrist fracture, closed 2012    right  . Neck mass 2006  . Chronic low back pain   . Chronic neck pain     MEDS:  Outpatient Prescriptions Prior to Visit  Medication Sig Dispense Refill  . diazepam (VALIUM) 10 MG tablet Take 1 tablet (10 mg total) by mouth at bedtime as needed for anxiety.  30 tablet  2  . hydrochlorothiazide (HYDRODIURIL) 25 MG tablet Take 1 tablet (25 mg total) by mouth daily.  30 tablet  6  . Multiple Vitamin (MULTIVITAMIN) tablet Take 1 tablet by mouth daily.        . Omega-3 Fatty Acids (FISH OIL) 1200 MG CAPS Take by mouth daily.        Marland Kitchen oxyCODONE (ROXICODONE) 15 MG immediate release tablet 1 tab po bid prn  45 tablet  0  . Vitamin D, Ergocalciferol, (DRISDOL) 50000 UNITS CAPS capsule Take 50,000 Units by mouth every 7 (seven) days.      Marland Kitchen amLODipine-benazepril (LOTREL) 5-20 MG per capsule Take 1 capsule by mouth daily.  30 capsule  0    No facility-administered medications prior to visit.    PE: Blood pressure 129/74, pulse 75, temperature 98.2 F (36.8 C), temperature source Temporal, resp. rate 18, height 5\' 5"  (1.651 m), weight 176 lb (79.833 kg), SpO2 98.00%. Gen: Alert, well appearing.  Patient is oriented to person, place, time, and situation. WUJ:WJXB: no injection, icteris, swelling, or exudate.  EOMI, PERRLA. Mouth: lips without lesion/swelling.  Oral mucosa pink and moist. Oropharynx without erythema, exudate, or swelling.  Neck - No masses or thyromegaly or limitation in range of motion CV: RRR, no m/r/g.   LUNGS: CTA bilat, nonlabored resps, mildly diminished breath sounds throughout chest.   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 Pain well controlled and she is functioning well taking care of chronically ill husband. UDS last visit had appropriate results. Continue current pain regimen: oxycodone 15mg , 1 tab bid prn, #45 per month.   Rx for this month and then a rx for each of the next 2 months with approp fill on/after dating on rx's.  Pneumovax 23 IM today.  An After Visit Summary was printed and given to the patient.  FOLLOW UP: 4 mo

## 2013-01-19 DIAGNOSIS — M542 Cervicalgia: Secondary | ICD-10-CM | POA: Diagnosis not present

## 2013-01-19 DIAGNOSIS — IMO0001 Reserved for inherently not codable concepts without codable children: Secondary | ICD-10-CM | POA: Diagnosis not present

## 2013-01-19 DIAGNOSIS — M545 Low back pain: Secondary | ICD-10-CM | POA: Diagnosis not present

## 2013-01-24 ENCOUNTER — Ambulatory Visit: Payer: Medicare Other | Admitting: Family Medicine

## 2013-04-14 ENCOUNTER — Telehealth: Payer: Self-pay | Admitting: Family Medicine

## 2013-04-14 NOTE — Telephone Encounter (Signed)
Patient is requesting a refill on Oxycodone, she wants it before the end of the day if possible. Please call patient back either way

## 2013-04-14 NOTE — Telephone Encounter (Signed)
Patient's last visit was on 01/18/13.  Last rx stated refill on or after 03/19/12.  She shouldn't need a refill until 04/18/13.  Please advise.

## 2013-04-17 MED ORDER — OXYCODONE HCL 15 MG PO TABS: ORAL_TABLET | ORAL | Status: DC

## 2013-04-17 NOTE — Telephone Encounter (Signed)
Oxycodone rx printed. 

## 2013-04-17 NOTE — Telephone Encounter (Signed)
LMOM for patient to pick up Rx at front desk.

## 2013-04-24 ENCOUNTER — Encounter: Payer: Self-pay | Admitting: Family Medicine

## 2013-04-24 ENCOUNTER — Ambulatory Visit (INDEPENDENT_AMBULATORY_CARE_PROVIDER_SITE_OTHER): Payer: Medicare Other | Admitting: Family Medicine

## 2013-04-24 VITALS — BP 135/90 | HR 87 | Temp 97.4°F | Resp 18 | Ht 65.0 in | Wt 174.0 lb

## 2013-04-24 DIAGNOSIS — M545 Low back pain, unspecified: Secondary | ICD-10-CM | POA: Diagnosis not present

## 2013-04-24 DIAGNOSIS — G4700 Insomnia, unspecified: Secondary | ICD-10-CM | POA: Diagnosis not present

## 2013-04-24 DIAGNOSIS — F4323 Adjustment disorder with mixed anxiety and depressed mood: Secondary | ICD-10-CM | POA: Diagnosis not present

## 2013-04-24 DIAGNOSIS — F329 Major depressive disorder, single episode, unspecified: Secondary | ICD-10-CM | POA: Insufficient documentation

## 2013-04-24 DIAGNOSIS — F419 Anxiety disorder, unspecified: Secondary | ICD-10-CM

## 2013-04-24 DIAGNOSIS — M5459 Other low back pain: Secondary | ICD-10-CM

## 2013-04-24 MED ORDER — DIAZEPAM 10 MG PO TABS: 10.0000 mg | ORAL_TABLET | Freq: Every evening | ORAL | Status: DC | PRN

## 2013-04-24 MED ORDER — OXYCODONE HCL 15 MG PO TABS: ORAL_TABLET | ORAL | Status: DC

## 2013-04-24 MED ORDER — CITALOPRAM HYDROBROMIDE 20 MG PO TABS: 20.0000 mg | ORAL_TABLET | Freq: Every day | ORAL | Status: DC

## 2013-04-24 NOTE — Progress Notes (Signed)
Pre visit review using our clinic review tool, if applicable. No additional management support is needed unless otherwise documented below in the visit note. 

## 2013-04-24 NOTE — Progress Notes (Signed)
OFFICE NOTE  04/24/2013  CC:  Chief Complaint  Patient presents with  . Follow-up  . Depression     HPI: Patient is a 68 y.o. Caucasian female who is here for 4 mo med questions/follow up. Pt is depressed b/c husband is in ALF now with worsening vascular dementia; "I don't know what to do with myself now". She is tearful today, sobbing.  Crying spells a lot at home lately. Energy is low, hard to get up and even straighten the house up. Appetite down, sleep difficult.  Pain is the same: takes oxycodone the same way she had been before feeling depressed.   Has a sister locally for support.  No hobbies.  No church. Takes her valium avg of 1-2 times per day.  She says she has never been on an antidepressant before. No SI or HI.   No visual or auditory hallucinations, no delusional thinking.  No panic.   Pertinent PMH:  Past Medical History  Diagnosis Date  . COPD (chronic obstructive pulmonary disease)   . Hypertension   . Hyperlipidemia   . 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 Thyroid nodule, cold 2006    Biopsy negative per pt report.  U/s showed multinodular goiter, but TSH nl per pt, thyroid uptake scan nl except cold nodule.  . Breast nodule 2008    Multiple mammos and u/s's done to follow this nonpalpable nodule on right breast: pt reports it eventually resolved.  Screening mammo neg/nl 01/2009.  Marland Kitchen. Herpes zoster 2011  . Wrist fracture, closed 2012    right  . Neck mass 2006  . Chronic low back pain   . Chronic neck pain    Past Surgical History  Procedure Laterality Date  . Abdominal hysterectomy  approx 1997    Fibroids (ovaries ARE still in)  . Hemorrhoid surgery  approx 1982    MEDS:  Outpatient Prescriptions Prior to Visit  Medication Sig Dispense Refill  . diazepam (VALIUM) 10 MG tablet Take 1 tablet (10 mg total) by mouth at bedtime as needed for anxiety.  30 tablet  2  . hydrochlorothiazide (HYDRODIURIL) 25 MG tablet Take 1 tablet (25  mg total) by mouth daily.  30 tablet  6  . Multiple Vitamin (MULTIVITAMIN) tablet Take 1 tablet by mouth daily.        . Omega-3 Fatty Acids (FISH OIL) 1200 MG CAPS Take by mouth daily.        Marland Kitchen. oxyCODONE (ROXICODONE) 15 MG immediate release tablet 1 tab po bid prn  45 tablet  0  . amLODipine-benazepril (LOTREL) 5-20 MG per capsule Take 1 capsule by mouth daily.  30 capsule  0  . Vitamin D, Ergocalciferol, (DRISDOL) 50000 UNITS CAPS capsule Take 50,000 Units by mouth every 7 (seven) days.       No facility-administered medications prior to visit.    PE: Blood pressure 135/90, pulse 87, temperature 97.4 F (36.3 C), temperature source Temporal, resp. rate 18, height 5\' 5"  (1.651 m), weight 174 lb (78.926 kg), SpO2 97.00%. Gen: alert, tearful/crying.  She was able to chuckle and lauqh at herself some.  Lucid thought and speech. CV: RRR, no m/r/g.   LUNGS: CTA bilat, nonlabored resps, good aeration in all lung fields. EXT: no clubbing, cyanosis, or edema.    IMPRESSION AND PLAN:  1) Grieving/depression/adjustment d/o with depression and anxiety features. Start trial of citalopram 20mg  qd.  Continue diazepam 10mg  qhs, #30, RF x 2. Encouraged pt  to talk to sister, establish at a church, restart hobbies, stay active.  2) Chronic LBP: stable. She has rx for this month, recently filled. I printed rx's for oxycodone 15mg  today for April and May 2015.  Appropriate fill on/after date was noted on each rx.  An After Visit Summary was printed and given to the patient.  FOLLOW UP: 3-4 wks

## 2013-04-25 ENCOUNTER — Telehealth: Payer: Self-pay | Admitting: Family Medicine

## 2013-04-25 NOTE — Telephone Encounter (Signed)
Relevant patient education assigned to patient using Emmi. ° °

## 2013-06-13 ENCOUNTER — Ambulatory Visit (INDEPENDENT_AMBULATORY_CARE_PROVIDER_SITE_OTHER): Payer: Medicare Other | Admitting: Family Medicine

## 2013-06-13 ENCOUNTER — Encounter: Payer: Self-pay | Admitting: Family Medicine

## 2013-06-13 VITALS — BP 118/80 | HR 84 | Temp 98.0°F | Resp 18 | Ht 65.0 in | Wt 162.0 lb

## 2013-06-13 DIAGNOSIS — I10 Essential (primary) hypertension: Secondary | ICD-10-CM | POA: Diagnosis not present

## 2013-06-13 DIAGNOSIS — M5459 Other low back pain: Secondary | ICD-10-CM

## 2013-06-13 DIAGNOSIS — G4700 Insomnia, unspecified: Secondary | ICD-10-CM | POA: Diagnosis not present

## 2013-06-13 DIAGNOSIS — M545 Low back pain, unspecified: Secondary | ICD-10-CM | POA: Diagnosis not present

## 2013-06-13 DIAGNOSIS — F4323 Adjustment disorder with mixed anxiety and depressed mood: Secondary | ICD-10-CM | POA: Diagnosis not present

## 2013-06-13 MED ORDER — HYDROCHLOROTHIAZIDE 25 MG PO TABS: 25.0000 mg | ORAL_TABLET | Freq: Every day | ORAL | Status: DC

## 2013-06-13 MED ORDER — OXYCODONE HCL 15 MG PO TABS
ORAL_TABLET | ORAL | Status: DC
Start: 2013-06-13 — End: 2013-06-13

## 2013-06-13 MED ORDER — OXYCODONE HCL 15 MG PO TABS: ORAL_TABLET | ORAL | Status: DC

## 2013-06-13 MED ORDER — DIAZEPAM 10 MG PO TABS: 10.0000 mg | ORAL_TABLET | Freq: Every evening | ORAL | Status: DC | PRN

## 2013-06-13 MED ORDER — CITALOPRAM HYDROBROMIDE 20 MG PO TABS: 20.0000 mg | ORAL_TABLET | Freq: Every day | ORAL | Status: DC
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
## 2013-06-13 NOTE — Progress Notes (Signed)
Pre visit review using our clinic review tool, if applicable. No additional management support is needed unless otherwise documented below in the visit note. 

## 2013-06-13 NOTE — Assessment & Plan Note (Signed)
Mostly anxiety-related lately. Diazepam prn helping some.

## 2013-06-13 NOTE — Assessment & Plan Note (Signed)
The current medical regimen is effective;  continue present plan and medications.  

## 2013-06-13 NOTE — Assessment & Plan Note (Signed)
Doing ok. No med changes.  Push dose to citalopram 20mg  as tolerated, but it looks like all she can tolerate is 10mg  qd at this time. Continue diazepam 10mg  qhs prn, --gave rx for this today, #30, RF x 5.

## 2013-06-13 NOTE — Assessment & Plan Note (Signed)
Has not seen back specialist. At this point will continue with oxycodone to allow for functioning w/out severe pain, will increase monthly allotment to #60.   I printed rx's for oxycodone 15mg  1 bid prn today for this month, June 2015, and July 2015.  Appropriate fill on/after date was noted on each rx.  Called her pharmacy and had them shred the rx for #45 oxycodone that was to be filled in a few days. UDS with approp results in past.  Controlled subst contract in chart. Will hold off on referral to back specialist for now, as she is dealing with a lot with her husband's chronic mental/physical issues.

## 2013-06-14 ENCOUNTER — Telehealth: Payer: Self-pay | Admitting: Family Medicine

## 2013-06-14 NOTE — Telephone Encounter (Signed)
Relevant patient education assigned to patient using Emmi. ° °

## 2013-07-25 ENCOUNTER — Ambulatory Visit (HOSPITAL_BASED_OUTPATIENT_CLINIC_OR_DEPARTMENT_OTHER)
Admission: RE | Admit: 2013-07-25 | Discharge: 2013-07-25 | Disposition: A | Discharge status: Home / Self Care | Payer: Medicare Other | Source: Ambulatory Visit | Attending: Nurse Practitioner | Admitting: Nurse Practitioner

## 2013-07-25 ENCOUNTER — Ambulatory Visit (INDEPENDENT_AMBULATORY_CARE_PROVIDER_SITE_OTHER): Payer: Medicare Other | Admitting: Nurse Practitioner

## 2013-07-25 ENCOUNTER — Telehealth: Payer: Self-pay | Admitting: Nurse Practitioner

## 2013-07-25 ENCOUNTER — Encounter: Payer: Self-pay | Admitting: Nurse Practitioner

## 2013-07-25 DIAGNOSIS — M542 Cervicalgia: Secondary | ICD-10-CM | POA: Insufficient documentation

## 2013-07-25 DIAGNOSIS — M25519 Pain in unspecified shoulder: Secondary | ICD-10-CM | POA: Insufficient documentation

## 2013-07-25 DIAGNOSIS — S0993XA Unspecified injury of face, initial encounter: Secondary | ICD-10-CM | POA: Diagnosis not present

## 2013-07-25 MED ORDER — OXYCODONE HCL 15 MG PO TABS: ORAL_TABLET | ORAL | Status: DC

## 2013-07-25 NOTE — Patient Instructions (Signed)
Get xrays of neck to evaluate for fracture & misalignment.  You definitely have muscle strain. That will take about 2 weeks to improve. Take 600 mg ibuprophen twice daily. You may take with oxycodone. Our office will call with xray results.

## 2013-07-25 NOTE — Telephone Encounter (Signed)
Patient notified of results.
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
## 2013-07-25 NOTE — Progress Notes (Signed)
Pre visit review using our clinic review tool, if applicable. No additional management support is needed unless otherwise documented below in the visit note. 
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
## 2013-08-08 ENCOUNTER — Telehealth: Payer: Self-pay | Admitting: Family Medicine

## 2013-08-08 NOTE — Telephone Encounter (Signed)
Patient called requesting an early refill of her oxycodone because she is trying to go out of town tomorrow.  Patient's pharmacy has the RX they just need authorization to refill early.  Patient's RX is due 08/11/13.  Please advise.

## 2013-08-09 NOTE — Telephone Encounter (Signed)
Discussed with CMA, Faythe GheeLisa Albright by phone 08/08/13.  OK to authorize early RF with pharmacy.

## 2013-09-04 ENCOUNTER — Encounter: Payer: Self-pay | Admitting: Family Medicine

## 2013-09-04 ENCOUNTER — Ambulatory Visit (INDEPENDENT_AMBULATORY_CARE_PROVIDER_SITE_OTHER): Payer: Medicare Other | Admitting: Family Medicine

## 2013-09-04 VITALS — BP 156/87 | HR 88 | Temp 98.2°F | Resp 18 | Ht 65.0 in | Wt 160.0 lb

## 2013-09-04 DIAGNOSIS — F341 Dysthymic disorder: Secondary | ICD-10-CM

## 2013-09-04 DIAGNOSIS — F329 Major depressive disorder, single episode, unspecified: Secondary | ICD-10-CM

## 2013-09-04 DIAGNOSIS — M545 Low back pain, unspecified: Secondary | ICD-10-CM

## 2013-09-04 DIAGNOSIS — G4700 Insomnia, unspecified: Secondary | ICD-10-CM

## 2013-09-04 DIAGNOSIS — M5459 Other low back pain: Secondary | ICD-10-CM

## 2013-09-04 DIAGNOSIS — F419 Anxiety disorder, unspecified: Secondary | ICD-10-CM

## 2013-09-04 DIAGNOSIS — I10 Essential (primary) hypertension: Secondary | ICD-10-CM

## 2013-09-04 DIAGNOSIS — F32A Depression, unspecified: Secondary | ICD-10-CM

## 2013-09-04 MED ORDER — OXYCODONE HCL 15 MG PO TABS: ORAL_TABLET | ORAL | Status: DC

## 2013-09-04 MED ORDER — TRAZODONE HCL 100 MG PO TABS
100.0000 mg | ORAL_TABLET | Freq: Every day | ORAL | Status: DC
Start: 2013-09-04 — End: 2014-05-17

## 2013-09-04 NOTE — Assessment & Plan Note (Signed)
Increase trazodone to 100mg q hs
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
## 2013-09-13 ENCOUNTER — Telehealth: Payer: Self-pay | Admitting: Family Medicine

## 2013-09-13 DIAGNOSIS — K089 Disorder of teeth and supporting structures, unspecified: Secondary | ICD-10-CM

## 2013-09-13 NOTE — Telephone Encounter (Signed)
Please advise 

## 2013-09-13 NOTE — Telephone Encounter (Signed)
Patient is requesting a referral to a dentist that accepts Medicaid that does sedation dentistry. She wants to have 4 teeth removed & wants to get dentures.

## 2013-09-14 NOTE — Telephone Encounter (Signed)
Ordered referral to oral surgeon.  I specified that it needs to be a provider that accepts medicaid.

## 2013-09-25 ENCOUNTER — Other Ambulatory Visit: Payer: Self-pay

## 2013-09-25 ENCOUNTER — Other Ambulatory Visit: Payer: Self-pay | Admitting: Family Medicine

## 2013-09-25 MED ORDER — DIAZEPAM 10 MG PO TABS: 10.0000 mg | ORAL_TABLET | Freq: Two times a day (BID) | ORAL | Status: DC | PRN

## 2013-09-25 NOTE — Telephone Encounter (Signed)
Pls call pt's pharmacy and allow early RF of diazepam.  Pls notify pt that this is the only time I'll do this. I want her to take no more than 1 diazepam in the morning and 1 in the evening.-thx

## 2013-09-25 NOTE — Telephone Encounter (Signed)
Spoke with pt, advised message from Dr. Milinda CaveMcGowen. Pt understood. Faxed in Rx to pharmacy.

## 2013-09-25 NOTE — Telephone Encounter (Signed)
Patient is having dental surgery tomorrow. Between being anxious about the dental surgery tomorrow & spouse's health declining she has had to take 2 tablets in the evening instead of 1. Patient is out of the med. Can she get a refill?

## 2013-09-25 NOTE — Telephone Encounter (Signed)
Refill on diazepam

## 2013-10-03 ENCOUNTER — Telehealth: Payer: Self-pay | Admitting: Family Medicine

## 2013-10-03 NOTE — Telephone Encounter (Signed)
OK to fill the oxycodone rx today. Ask pharmacy if the RF's that they listed are all rx'd by me or were there any other providers who rx'd oxycodone for her?-thx

## 2013-10-03 NOTE — Telephone Encounter (Signed)
Pharm tech states all prescriptions were Rx'd by you except 07/31/13 # 8 was Rx'd by Layne.   Okay'd her early RX.

## 2013-10-03 NOTE — Telephone Encounter (Signed)
Pharmacy tech called stating Pt requesting Oxycodone  IR to be filled today and it states fill on or after 10/04/13.  They also wanted you to be aware of pt's fill history: 07/13/13 # 60 07/31/13 #8 08/08/13 # 60 09/04/13 # 75 Please advise.

## 2013-10-04 NOTE — Telephone Encounter (Signed)
Noted. thx 

## 2013-11-01 ENCOUNTER — Telehealth: Payer: Self-pay | Admitting: Family Medicine

## 2013-11-01 NOTE — Telephone Encounter (Signed)
Please advise 

## 2013-11-01 NOTE — Telephone Encounter (Signed)
Patient has used an extra dose of medication on Sunday due to bringing spouse home from hospital Sunday & having to push his wheelchair, she has re-injuried her back. She is also going to the dentist tomorrow & will need medication for appt. Can patient fill Rx today?

## 2013-11-02 NOTE — Telephone Encounter (Signed)
Spoke with the pharmacist and advised to refill early per Dr. Milinda Cave. Called pt to let her know.

## 2013-11-02 NOTE — Telephone Encounter (Signed)
Pls call pt's pharmacy and authorize oxycodone rx to be filled today (she already has one that should be on hold there that says "fill on/after 11/03/13", so a new rx does not need to be printed).-thx

## 2013-11-15 ENCOUNTER — Ambulatory Visit (INDEPENDENT_AMBULATORY_CARE_PROVIDER_SITE_OTHER): Payer: Medicare Other | Admitting: Family Medicine

## 2013-11-15 ENCOUNTER — Encounter: Payer: Self-pay | Admitting: Family Medicine

## 2013-11-15 VITALS — BP 171/95 | HR 72 | Temp 97.9°F | Resp 18 | Ht 65.0 in | Wt 171.0 lb

## 2013-11-15 DIAGNOSIS — F419 Anxiety disorder, unspecified: Secondary | ICD-10-CM

## 2013-11-15 DIAGNOSIS — F329 Major depressive disorder, single episode, unspecified: Secondary | ICD-10-CM

## 2013-11-15 DIAGNOSIS — M542 Cervicalgia: Secondary | ICD-10-CM | POA: Diagnosis not present

## 2013-11-15 DIAGNOSIS — R635 Abnormal weight gain: Secondary | ICD-10-CM

## 2013-11-15 DIAGNOSIS — I10 Essential (primary) hypertension: Secondary | ICD-10-CM

## 2013-11-15 DIAGNOSIS — Z23 Encounter for immunization: Secondary | ICD-10-CM | POA: Diagnosis not present

## 2013-11-15 DIAGNOSIS — G8929 Other chronic pain: Secondary | ICD-10-CM

## 2013-11-15 LAB — BASIC METABOLIC PANEL
BUN: 10 mg/dL (ref 6–23)
CHLORIDE: 106 meq/L (ref 96–112)
CO2: 23 mEq/L (ref 19–32)
CREATININE: 0.8 mg/dL (ref 0.4–1.2)
Calcium: 9 mg/dL (ref 8.4–10.5)
GFR: 74.12 mL/min (ref >60.00)
GLUCOSE: 83 mg/dL (ref 70–99)
Potassium: 4 mEq/L (ref 3.5–5.1)
Sodium: 139 mEq/L (ref 135–145)

## 2013-11-15 LAB — TSH: TSH: 0.42 u[IU]/mL (ref 0.35–4.50)

## 2013-11-15 MED ORDER — OXYCODONE HCL 15 MG PO TABS: ORAL_TABLET | ORAL | Status: DC

## 2013-11-15 NOTE — Assessment & Plan Note (Signed)
Noncompliant with med lately. Encouraged compliance. Check BMET today.

## 2013-11-15 NOTE — Progress Notes (Signed)
Pre visit review using our clinic review tool, if applicable. No additional management support is needed unless otherwise documented below in the visit note. 

## 2013-11-15 NOTE — Assessment & Plan Note (Signed)
Needs to use NSAIDs more, discussed appropriate use of these. May continue to use oxycodone as per our agreement. Most recent rx filled 11/01/13. I printed rx's for oxycodone 15mg , 1 tab tid prn, #75 today for October and November 2015.  Appropriate fill on/after date was noted on each rx.

## 2013-11-15 NOTE — Progress Notes (Signed)
OFFICE VISIT  11/15/2013   CC:  Chief Complaint  Patient presents with  . Follow-up   HPI:    Patient is a 68 y.o. Caucasian female who presents for 2 and 1/2 mo f/u HTN, chronic LBP, anxiety/depression. Pain "constant" in neck and shoulders.  Left 5th digit stays numb, feels constant "dull" pains down both shoulders and arms.   Says she is taking her bp med regulary but "I missed the last 3 days"-b/c she forgot/was busy. She does no home bp monitoring.  Takes one advil once every 2-3 days.    Mood/anxiety stable on citalopram and diazepam. Wt up lately, hasn't had thyroid screen in a while.  She also requests diabetes screening.  She says she has had no food today but drank a Dr. Reino Kent earlier today.  Past Medical History  Diagnosis Date  . COPD (chronic obstructive pulmonary disease)   . Hypertension   . Hyperlipidemia   . 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 Thyroid nodule, cold 2006    Biopsy negative per pt report.  U/s showed multinodular goiter, but TSH nl per pt, thyroid uptake scan nl except cold nodule.  . Breast nodule 2008    Multiple mammos and u/s's done to follow this nonpalpable nodule on right breast: pt reports it eventually resolved.  Screening mammo neg/nl 01/2009.  Marland Kitchen Herpes zoster 2011  . Wrist fracture, closed 2012    right  . Neck mass 2006  . Chronic low back pain   . Chronic neck pain     Past Surgical History  Procedure Laterality Date  . Abdominal hysterectomy  approx 1997    Fibroids (ovaries ARE still in)  . Hemorrhoid surgery  approx 1982    Outpatient Prescriptions Prior to Visit  Medication Sig Dispense Refill  . citalopram (CELEXA) 20 MG tablet Take 1 tablet (20 mg total) by mouth daily.  30 tablet  3  . diazepam (VALIUM) 10 MG tablet Take 1 tablet (10 mg total) by mouth every 12 (twelve) hours as needed for anxiety.  60 tablet  5  . hydrochlorothiazide (HYDRODIURIL) 25 MG tablet Take 1 tablet (25 mg total) by  mouth daily.  30 tablet  11  . Multiple Vitamin (MULTIVITAMIN) tablet Take 1 tablet by mouth daily.        . Omega-3 Fatty Acids (FISH OIL) 1200 MG CAPS Take by mouth daily.        . traZODone (DESYREL) 100 MG tablet Take 1 tablet (100 mg total) by mouth at bedtime.  30 tablet  6  . Vitamin D, Ergocalciferol, (DRISDOL) 50000 UNITS CAPS capsule Take 50,000 Units by mouth every 7 (seven) days.      Marland Kitchen oxyCODONE (ROXICODONE) 15 MG immediate release tablet 1 tab po tid prn  75 tablet  0   No facility-administered medications prior to visit.    No Known Allergies  ROS As per HPI PE: Blood pressure 171/95, pulse 72, temperature 97.9 F (36.6 C), temperature source Oral, resp. rate 18, height 5\' 5"  (1.651 m), weight 171 lb (77.565 kg), SpO2 97.00%. Gen: Alert, well appearing.  Patient is oriented to person, place, time, and situation. AFFECT: pleasant, lucid thought and speech. CV: RRR, no m/r/g.   LUNGS: CTA bilat, nonlabored resps, good aeration in all lung fields. EXT: no clubbing, cyanosis, or edema.   LABS:  None today  IMPRESSION AND PLAN:  HTN (hypertension), benign Noncompliant with med lately. Encouraged compliance. Check BMET today.  Chronic neck pain Needs to use NSAIDs more, discussed appropriate use of these. May continue to use oxycodone as per our agreement. Most recent rx filled 11/01/13. I printed rx's for oxycodone 15mg , 1 tab tid prn, #75 today for October and November 2015.  Appropriate fill on/after date was noted on each rx.   Anxiety and depression The current medical regimen is effective;  continue present plan and medications. No rf's needed today.   Preventative health care: Prevnar 13 and flu vaccine IM today.  An After Visit Summary was printed and given to the patient.  FOLLOW UP: Return in about 6 months (around 05/17/2014) for routine chronic illness f/u.

## 2013-11-15 NOTE — Assessment & Plan Note (Signed)
The current medical regimen is effective;  continue present plan and medications. No rf's needed today.

## 2013-11-15 NOTE — Addendum Note (Signed)
Addended by: Eulah PontALBRIGHT, LISA M on: 11/15/2013 04:00 PM   Modules accepted: Orders

## 2013-11-23 ENCOUNTER — Other Ambulatory Visit: Payer: Self-pay | Admitting: Family Medicine

## 2013-11-23 DIAGNOSIS — Z1231 Encounter for screening mammogram for malignant neoplasm of breast: Secondary | ICD-10-CM

## 2013-11-27 ENCOUNTER — Ambulatory Visit (HOSPITAL_BASED_OUTPATIENT_CLINIC_OR_DEPARTMENT_OTHER): Payer: Medicare Other

## 2013-11-28 ENCOUNTER — Ambulatory Visit (HOSPITAL_BASED_OUTPATIENT_CLINIC_OR_DEPARTMENT_OTHER): Payer: Medicare Other

## 2013-11-28 ENCOUNTER — Telehealth: Payer: Self-pay | Admitting: Family Medicine

## 2013-11-28 NOTE — Telephone Encounter (Signed)
Pt called requesting rf of her oxycodone early.  Patient is not due for RX until 12/02/13.  Pt has requested early refills the last two months.  I told her that she needs to stop requesting early rfs on this medication which is why she had to sign a controlled substance contract.  Pt states that she doesn't want to go through "with drawl" sxs.   Please advise early refill.  I told pt that Dr. Milinda CaveMcGowen was out of the office the rest of week but he would be trying to check messages.

## 2013-11-30 NOTE — Telephone Encounter (Signed)
Patient notified

## 2013-11-30 NOTE — Telephone Encounter (Signed)
No early RF.

## 2013-12-25 ENCOUNTER — Telehealth: Payer: Self-pay | Admitting: Family Medicine

## 2013-12-25 NOTE — Telephone Encounter (Signed)
Patient states the pharmacy does have her RX.  She will contact us when she needs refill for December.

## 2013-12-25 NOTE — Telephone Encounter (Signed)
Patient aware that she should have another RX for November.  She is going to call her pharmacy to see if they have it.

## 2013-12-25 NOTE — Telephone Encounter (Signed)
Patient is requesting to pick up her 3 month supply Oxycodone paper Rx so she can drop it off at her pharmacy. She said she thinks her refill date is the 6824 or 25th.

## 2014-01-03 ENCOUNTER — Ambulatory Visit: Payer: Medicare Other | Admitting: Family Medicine

## 2014-01-19 ENCOUNTER — Telehealth: Payer: Self-pay | Admitting: Family Medicine

## 2014-01-19 NOTE — Telephone Encounter (Signed)
She is requesting RF too soon. The instructions say "1 three times a day as needed" so this means she has to make the 75 pills last a month. If it said "1 three times a day", then I would dispense #90 and this would have to last her 30 days. Even if I allowed her to pick up rx this coming Monday, this would be 9d too early.  Tell her I will not fill her rx's early like this b/c she has been warned about this several times AND it states this on her controlled substance contract that she signed. I will refer her to a pain management clinic if she wants.  Let me know.-thx

## 2014-01-19 NOTE — Telephone Encounter (Signed)
Dr McGowen please advise. 

## 2014-01-19 NOTE — Telephone Encounter (Signed)
Patient is going to run out of oxycodone soon. Can she pick up her Rx's for the next 3 months. She did say that the way her Rx is written it is only good for 25 days if she takes it 3 x a day.

## 2014-01-23 NOTE — Telephone Encounter (Signed)
Spoke with pt, she states she doesn't need a refill now, she just wants three months so she can keep on file at the pharmacy. Since it is the holidays she is wanted to request them early but also it will save her a trip because she states she is in pain when she drives. She does take three pills a day so she is wondering if she can be prescribed #90 so she doesn't run out. She states you can date the RX's for the time they are due and not early. Please advise.

## 2014-01-24 MED ORDER — OXYCODONE HCL 15 MG PO TABS: ORAL_TABLET | ORAL | Status: DC

## 2014-01-24 MED ORDER — OXYCODONE HCL 15 MG PO TABS
ORAL_TABLET | ORAL | Status: DC
Start: 2014-01-24 — End: 2014-01-24

## 2014-01-24 NOTE — Telephone Encounter (Signed)
OK, oxycodine rx for #90 printed: one for this month, one for Jan 2016, one for Feb 2016.-thx

## 2014-01-25 NOTE — Telephone Encounter (Signed)
Pt aware rx's are at front desk for p/u. 

## 2014-02-21 ENCOUNTER — Telehealth: Payer: Self-pay | Admitting: Family Medicine

## 2014-02-21 NOTE — Telephone Encounter (Signed)
Would like a RX for Radatine 150 mg to be called in./DH

## 2014-02-21 NOTE — Telephone Encounter (Signed)
Ranitidine is not on pt's med list.  Okay to fill?

## 2014-02-21 NOTE — Telephone Encounter (Signed)
Yes, ok to fill ranitidine 150mg , 1 tab po bid, #60, RF x 6.-thx

## 2014-02-22 MED ORDER — RANITIDINE HCL 150 MG PO TABS: 150.0000 mg | ORAL_TABLET | Freq: Two times a day (BID) | ORAL | Status: DC

## 2014-02-22 NOTE — Telephone Encounter (Signed)
Rx sent 

## 2014-02-28 ENCOUNTER — Telehealth: Payer: Self-pay | Admitting: Family Medicine

## 2014-02-28 ENCOUNTER — Telehealth: Payer: Self-pay

## 2014-02-28 NOTE — Telephone Encounter (Signed)
The pharmacist from [facility] called requesting early refill on her Oxycodone medication. Pt is scared she will not be able to get her medication due to the weather we may have. I asked Dr. Milinda CaveMcGowen and he verbalized it was ok to fill the Rx a day early due to the circumstance. I called Angie at [facility] to let her know.

## 2014-02-28 NOTE — Telephone Encounter (Signed)
Yes I took care of this. I spoke with Angie at [facility].

## 2014-02-28 NOTE — Telephone Encounter (Signed)
Patient has Rx at pharmacy but it's dated 03/01/14 for p/u.  OKay to fill today due to weather coming up?

## 2014-02-28 NOTE — Telephone Encounter (Signed)
Patient would like to pick up Rx today due to snow predicted this afternoon. She is not out of medication, she is afraid to wait in case the driving conditions are bad.

## 2014-02-28 NOTE — Telephone Encounter (Signed)
Yes, ok. Delaney Meigsamara, you took care of this didn't you? -thx

## 2014-03-19 ENCOUNTER — Telehealth: Payer: Self-pay | Admitting: Family Medicine

## 2014-03-19 NOTE — Telephone Encounter (Signed)
RF request for valium.  Last OV was 11/15/13.  Last RX printed 09/25/13 x 5 rfs.  Please advise.

## 2014-03-19 NOTE — Telephone Encounter (Signed)
Pt called and needs refill on Diazopam Pharmacy said she neede to call and have a  Refill submitted.

## 2014-04-23 ENCOUNTER — Telehealth: Payer: Self-pay | Admitting: Family Medicine

## 2014-04-23 MED ORDER — OXYCODONE HCL 15 MG PO TABS: ORAL_TABLET | ORAL | Status: DC

## 2014-04-23 NOTE — Telephone Encounter (Signed)
Pt last OV was 11/15/13.  Last Rx stated fill on or after 03/31/14.  Pt isn't due until 04/30/14.  Please advise.

## 2014-04-23 NOTE — Telephone Encounter (Signed)
Rx printed with fill on/after date of 04/28/14.

## 2014-04-23 NOTE — Telephone Encounter (Signed)
Patient needs to pick up Rx for Oxycodone. Please call when ready.

## 2014-04-24 NOTE — Telephone Encounter (Signed)
Patient notified

## 2014-05-17 ENCOUNTER — Encounter: Payer: Self-pay | Admitting: Family Medicine

## 2014-05-17 ENCOUNTER — Ambulatory Visit (INDEPENDENT_AMBULATORY_CARE_PROVIDER_SITE_OTHER): Payer: Medicare Other | Admitting: Family Medicine

## 2014-05-17 VITALS — BP 120/80 | HR 84 | Temp 98.0°F | Ht 65.0 in | Wt 160.0 lb

## 2014-05-17 DIAGNOSIS — I10 Essential (primary) hypertension: Secondary | ICD-10-CM

## 2014-05-17 DIAGNOSIS — H269 Unspecified cataract: Secondary | ICD-10-CM

## 2014-05-17 DIAGNOSIS — F419 Anxiety disorder, unspecified: Secondary | ICD-10-CM | POA: Diagnosis not present

## 2014-05-17 DIAGNOSIS — G894 Chronic pain syndrome: Secondary | ICD-10-CM

## 2014-05-17 MED ORDER — ERGOCALCIFEROL 50 MCG (2000 UT) PO TABS: ORAL_TABLET | ORAL | Status: DC

## 2014-05-17 MED ORDER — OXYCODONE HCL 15 MG PO TABS: ORAL_TABLET | ORAL | Status: DC

## 2014-05-17 NOTE — Progress Notes (Signed)
Pre visit review using our clinic review tool, if applicable. No additional management support is needed unless otherwise documented below in the visit note. 

## 2014-05-17 NOTE — Patient Instructions (Signed)
Pls give pt contact # so she can schedule the mammogram that was ordered 11/2013 when she is ready to do this test.-thx

## 2014-05-17 NOTE — Addendum Note (Signed)
Addended by: Jeoffrey MassedMCGOWEN, PHILIP H on: 05/17/2014 03:14 PM   Modules accepted: Orders

## 2014-05-17 NOTE — Progress Notes (Addendum)
OFFICE VISIT  05/17/2014   CC:  Chief Complaint  Patient presents with  . Follow-up    4 months   HPI:    Patient is a 69 y.o. Caucasian female who presents for 6 mo f/u HTN, anxiety and depression, and chronic neck and low back pain which requires narcotic pain meds for pt to be functional. Doing fine/stable. Does not take citalopram anymore, nor does she take trazodone (nightmares).  Takes advil PM.  Has to get cataract removed from R eye.  Pain med use is 3 tabs per day unless she does excessive physical exercise and on these days it takes 4. Pain starts in neck and sometimes goes into both shoulders/arms. Also low back, no radiation, says her pain is "always severe".  She is not interested in ortho/neurosurg consultation at this time.     Past Medical History  Diagnosis Date  . COPD (chronic obstructive pulmonary disease)   . Hypertension   . Hyperlipidemia   . 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 Thyroid nodule, cold 2006    Biopsy negative per pt report.  U/s showed multinodular goiter, but TSH nl per pt, thyroid uptake scan nl except cold nodule.  . Breast nodule 2008    Multiple mammos and u/s's done to follow this nonpalpable nodule on right breast: pt reports it eventually resolved.  Screening mammo neg/nl 01/2009.  Marland Kitchen Herpes zoster 2011  . Wrist fracture, closed 2012    right  . Neck mass 2006  . Chronic low back pain   . Chronic neck pain     Past Surgical History  Procedure Laterality Date  . Abdominal hysterectomy  approx 1997    Fibroids (ovaries ARE still in)  . Hemorrhoid surgery  approx 1982    Outpatient Prescriptions Prior to Visit  Medication Sig Dispense Refill  . diazepam (VALIUM) 10 MG tablet TAKE ONE TABLET BY MOUTH EVERY 12 HOURS AS NEEDED FOR ANXIETY 60 tablet 5  . hydrochlorothiazide (HYDRODIURIL) 25 MG tablet Take 1 tablet (25 mg total) by mouth daily. 30 tablet 11  . Multiple Vitamin (MULTIVITAMIN) tablet Take 1  tablet by mouth daily.      . Omega-3 Fatty Acids (FISH OIL) 1200 MG CAPS Take by mouth daily.      . ranitidine (ZANTAC) 150 MG tablet Take 1 tablet (150 mg total) by mouth 2 (two) times daily. 60 tablet 6  . citalopram (CELEXA) 20 MG tablet Take 1 tablet (20 mg total) by mouth daily. 30 tablet 3  . oxyCODONE (ROXICODONE) 15 MG immediate release tablet 1 tab po tid prn 90 tablet 0  . traZODone (DESYREL) 100 MG tablet Take 1 tablet (100 mg total) by mouth at bedtime. 30 tablet 6  . Vitamin D, Ergocalciferol, (DRISDOL) 50000 UNITS CAPS capsule Take 50,000 Units by mouth every 7 (seven) days.     No facility-administered medications prior to visit.    No Known Allergies  ROS As per HPI  PE: Blood pressure 120/80, pulse 84, temperature 98 F (36.7 C), temperature source Oral, height  (1.651 m), weight 160 lb (72.576 kg), SpO2 94 %. Gen: Alert, well appearing.  Patient is oriented to person, place, time, and situation. CV: RRR, no m/r/g.   LUNGS: CTA bilat, nonlabored resps, good aeration in all lung fields. EXT: no clubbing, cyanosis, or edema. \  LABS:    Chemistry      Component Value Date/Time   NA 139 11/15/2013 1545  K 4.0 11/15/2013 1545   CL 106 11/15/2013 1545   CO2 23 11/15/2013 1545   BUN 10 11/15/2013 1545   CREATININE 0.8 11/15/2013 1545   CREATININE 0.80 01/15/2011 1433      Component Value Date/Time   CALCIUM 9.0 11/15/2013 1545   ALKPHOS 97 11/28/2012 1401   AST 24 11/28/2012 1401   ALT 33 11/28/2012 1401   BILITOT 0.6 11/28/2012 1401     Lab Results  Component Value Date   TSH 0.42 11/15/2013     IMPRESSION AND PLAN:  1) HTN; The current medical regimen is effective;  continue present plan and medications.  2) Chronic pain syndrome: neck and LB musculoskeletal pain. I am treating pain in good faith and we've decided she may have a new monthly dispensation amount of 120. I printed rx's for oxycodone 15mg , 1 qid, #120  today for this month, May  2016, and June 2016.   Appropriate fill on/after date was noted on each rx.  3) Chronic anxiety: stable on diazepam.  No new rx needed today.  4) chronic insomnia, mostly related to her pain and anxiety.  She may continue advil PM prn as this seems to help as well as past rx sleep aids and causes her less side effects.  5) Cataract R eye per pt report: needs ophthalmology referral so this was ordered today.  An After Visit Summary was printed and given to the patient.  FOLLOW UP: Return in about 4 months (around 09/16/2014) for routine chronic illness f/u (30 min).

## 2014-05-21 ENCOUNTER — Telehealth: Payer: Self-pay | Admitting: *Deleted

## 2014-05-21 NOTE — Telephone Encounter (Signed)
Called and informed patient,. 

## 2014-05-21 NOTE — Telephone Encounter (Signed)
No.  No med to take. Norovirus is a VIRUS--she needs to simply stay away from him/his NH as much as possible until the people there are no longer having infections (likely a week or so at the most).-thx

## 2014-05-21 NOTE — Telephone Encounter (Signed)
Patient left vm stating that her husband Fayrene FearingJames was exposed to the Norovirus at the home care facility where he resides. Patient stated that Fayrene FearingJames is having symptoms and she is afraid of contracting the virus from him. Patient wanted to know if there is something she can take (medication) that will prevent her from contracting the virus? Please advise?

## 2014-05-22 ENCOUNTER — Telehealth: Payer: Self-pay | Admitting: Family Medicine

## 2014-05-22 DIAGNOSIS — M545 Low back pain: Principal | ICD-10-CM

## 2014-05-22 DIAGNOSIS — G8929 Other chronic pain: Secondary | ICD-10-CM

## 2014-05-22 NOTE — Telephone Encounter (Signed)
Patient is requesting a CT scan of her back. It is still hurting. She would [facility] to go to [facility].

## 2014-05-22 NOTE — Telephone Encounter (Signed)
Please advise 

## 2014-05-23 NOTE — Telephone Encounter (Signed)
Tell pt we have to start with plain x-rays, so I have placed order for these to be done at [facility]. After these results are back, we'll decide if more sophisticated imaging is needed (by the way, tell her the preferred test at that point would be MRI, not CT).-thx

## 2014-05-24 NOTE — Telephone Encounter (Signed)
Patient agreeable

## 2014-05-25 ENCOUNTER — Ambulatory Visit (HOSPITAL_BASED_OUTPATIENT_CLINIC_OR_DEPARTMENT_OTHER)
Admission: RE | Admit: 2014-05-25 | Discharge: 2014-05-25 | Disposition: A | Discharge status: Home / Self Care | Payer: Medicare Other | Source: Ambulatory Visit | Attending: Family Medicine | Admitting: Family Medicine

## 2014-05-25 DIAGNOSIS — G8929 Other chronic pain: Secondary | ICD-10-CM

## 2014-05-25 DIAGNOSIS — M545 Low back pain, unspecified: Secondary | ICD-10-CM

## 2014-05-25 DIAGNOSIS — M5136 Other intervertebral disc degeneration, lumbar region: Secondary | ICD-10-CM | POA: Diagnosis not present

## 2014-05-25 DIAGNOSIS — M4186 Other forms of scoliosis, lumbar region: Secondary | ICD-10-CM | POA: Diagnosis not present

## 2014-05-28 ENCOUNTER — Other Ambulatory Visit: Payer: Self-pay | Admitting: Family Medicine

## 2014-05-28 DIAGNOSIS — G8929 Other chronic pain: Secondary | ICD-10-CM

## 2014-05-28 DIAGNOSIS — M5136 Other intervertebral disc degeneration, lumbar region: Secondary | ICD-10-CM

## 2014-05-28 DIAGNOSIS — M545 Low back pain, unspecified: Secondary | ICD-10-CM

## 2014-05-30 ENCOUNTER — Telehealth: Payer: Self-pay | Admitting: Family Medicine

## 2014-05-30 NOTE — Telephone Encounter (Signed)
Please advise 

## 2014-05-30 NOTE — Telephone Encounter (Signed)
Patient is having MRI this Saturday 06/02/14. Please send in Rx for claustrophobia.

## 2014-05-30 NOTE — Telephone Encounter (Signed)
I already prescribe her valium. She may take an extra 1/2 valium tab 1 hour prior to her MRI.

## 2014-05-30 NOTE — Telephone Encounter (Signed)
Patient aware.

## 2014-06-02 ENCOUNTER — Ambulatory Visit (HOSPITAL_BASED_OUTPATIENT_CLINIC_OR_DEPARTMENT_OTHER)
Admission: RE | Admit: 2014-06-02 | Discharge: 2014-06-02 | Disposition: A | Discharge status: Home / Self Care | Payer: Medicare Other | Source: Ambulatory Visit | Attending: Family Medicine | Admitting: Family Medicine

## 2014-06-02 DIAGNOSIS — M5136 Other intervertebral disc degeneration, lumbar region: Secondary | ICD-10-CM

## 2014-06-02 DIAGNOSIS — M545 Low back pain, unspecified: Secondary | ICD-10-CM

## 2014-06-02 DIAGNOSIS — M5126 Other intervertebral disc displacement, lumbar region: Secondary | ICD-10-CM | POA: Diagnosis not present

## 2014-06-02 DIAGNOSIS — G8929 Other chronic pain: Secondary | ICD-10-CM

## 2014-06-04 ENCOUNTER — Encounter: Payer: Self-pay | Admitting: Family Medicine

## 2014-06-04 ENCOUNTER — Other Ambulatory Visit: Payer: Self-pay | Admitting: Family Medicine

## 2014-06-04 DIAGNOSIS — M5136 Other intervertebral disc degeneration, lumbar region: Secondary | ICD-10-CM

## 2014-06-04 DIAGNOSIS — M545 Low back pain: Principal | ICD-10-CM

## 2014-06-04 DIAGNOSIS — G8929 Other chronic pain: Secondary | ICD-10-CM

## 2014-06-13 ENCOUNTER — Encounter: Payer: Self-pay | Admitting: Family Medicine

## 2014-06-25 ENCOUNTER — Telehealth: Payer: Self-pay

## 2014-06-25 NOTE — Telephone Encounter (Signed)
Patient had mammogram done, 12/2012 at [facility]. Was normal.

## 2014-07-09 ENCOUNTER — Other Ambulatory Visit: Payer: Self-pay | Admitting: Family Medicine

## 2014-07-16 ENCOUNTER — Emergency Department (HOSPITAL_COMMUNITY): Payer: Medicare Other

## 2014-07-16 ENCOUNTER — Encounter (HOSPITAL_COMMUNITY): Payer: Self-pay | Admitting: *Deleted

## 2014-07-16 ENCOUNTER — Emergency Department (HOSPITAL_COMMUNITY)
Admission: EM | Admit: 2014-07-16 | Discharge: 2014-07-16 | Disposition: A | Discharge status: Home / Self Care | Payer: Medicare Other | Attending: Emergency Medicine | Admitting: Emergency Medicine

## 2014-07-16 DIAGNOSIS — Z79899 Other long term (current) drug therapy: Secondary | ICD-10-CM | POA: Diagnosis not present

## 2014-07-16 DIAGNOSIS — S0990XA Unspecified injury of head, initial encounter: Secondary | ICD-10-CM | POA: Diagnosis present

## 2014-07-16 DIAGNOSIS — G8929 Other chronic pain: Secondary | ICD-10-CM | POA: Diagnosis not present

## 2014-07-16 DIAGNOSIS — Z8639 Personal history of other endocrine, nutritional and metabolic disease: Secondary | ICD-10-CM | POA: Diagnosis not present

## 2014-07-16 DIAGNOSIS — I10 Essential (primary) hypertension: Secondary | ICD-10-CM | POA: Insufficient documentation

## 2014-07-16 DIAGNOSIS — S161XXA Strain of muscle, fascia and tendon at neck level, initial encounter: Secondary | ICD-10-CM | POA: Insufficient documentation

## 2014-07-16 DIAGNOSIS — Z8659 Personal history of other mental and behavioral disorders: Secondary | ICD-10-CM | POA: Insufficient documentation

## 2014-07-16 DIAGNOSIS — Z8739 Personal history of other diseases of the musculoskeletal system and connective tissue: Secondary | ICD-10-CM | POA: Insufficient documentation

## 2014-07-16 DIAGNOSIS — Z792 Long term (current) use of antibiotics: Secondary | ICD-10-CM | POA: Diagnosis not present

## 2014-07-16 DIAGNOSIS — Y9389 Activity, other specified: Secondary | ICD-10-CM | POA: Diagnosis not present

## 2014-07-16 DIAGNOSIS — S199XXA Unspecified injury of neck, initial encounter: Secondary | ICD-10-CM | POA: Diagnosis not present

## 2014-07-16 DIAGNOSIS — Y998 Other external cause status: Secondary | ICD-10-CM | POA: Diagnosis not present

## 2014-07-16 DIAGNOSIS — J441 Chronic obstructive pulmonary disease with (acute) exacerbation: Secondary | ICD-10-CM | POA: Diagnosis not present

## 2014-07-16 DIAGNOSIS — M25511 Pain in right shoulder: Secondary | ICD-10-CM | POA: Diagnosis not present

## 2014-07-16 DIAGNOSIS — Z8619 Personal history of other infectious and parasitic diseases: Secondary | ICD-10-CM | POA: Insufficient documentation

## 2014-07-16 DIAGNOSIS — Z8781 Personal history of (healed) traumatic fracture: Secondary | ICD-10-CM | POA: Diagnosis not present

## 2014-07-16 DIAGNOSIS — Z8742 Personal history of other diseases of the female genital tract: Secondary | ICD-10-CM | POA: Insufficient documentation

## 2014-07-16 DIAGNOSIS — T148 Other injury of unspecified body region: Secondary | ICD-10-CM | POA: Diagnosis not present

## 2014-07-16 DIAGNOSIS — Y92410 Unspecified street and highway as the place of occurrence of the external cause: Secondary | ICD-10-CM | POA: Insufficient documentation

## 2014-07-16 MED ORDER — ONDANSETRON HCL 4 MG/2ML IJ SOLN
4.0000 mg | Freq: Once | INTRAMUSCULAR | Status: AC
  Administered 2014-07-16: 4 mg via INTRAVENOUS
  Filled 2014-07-16: qty 2

## 2014-07-16 MED ORDER — HYDROMORPHONE HCL 1 MG/ML IJ SOLN
0.5000 mg | Freq: Once | INTRAMUSCULAR | Status: AC
  Administered 2014-07-16: 0.5 mg via INTRAVENOUS
  Filled 2014-07-16: qty 1
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
## 2014-07-16 NOTE — Discharge Instructions (Signed)
Continue your current medications as prescribed.  Your CT scan showed a thyroid goiter and emphysema in your lungs.  Please have this followed up by your family doctor.     Cervical Sprain A cervical sprain is an injury in the neck in which the strong, fibrous tissues (ligaments) that connect your neck bones stretch or tear. Cervical sprains can range from mild to severe. Severe cervical sprains can cause the neck vertebrae to be unstable. This can lead to damage of the spinal cord and can result in serious nervous system problems. The amount of time it takes for a cervical sprain to get better depends on the cause and extent of the injury. Most cervical sprains heal in 1 to 3 weeks. CAUSES  Severe cervical sprains may be caused by:   Contact sport injuries (such as from football, rugby, wrestling, hockey, auto racing, gymnastics, diving, martial arts, or boxing).   Motor vehicle collisions.   Whiplash injuries. This is an injury from a sudden forward and backward whipping movement of the head and neck.  Falls.  Mild cervical sprains may be caused by:   Being in an awkward position, such as while cradling a telephone between your ear and shoulder.   Sitting in a chair that does not offer proper support.   Working at a poorly Marketing executive station.   Looking up or down for long periods of time.  SYMPTOMS   Pain, soreness, stiffness, or a burning sensation in the front, back, or sides of the neck. This discomfort may develop immediately after the injury or slowly, 24 hours or more after the injury.   Pain or tenderness directly in the middle of the back of the neck.   Shoulder or upper back pain.   Limited ability to move the neck.   Headache.   Dizziness.   Weakness, numbness, or tingling in the hands or arms.   Muscle spasms.   Difficulty swallowing or chewing.   Tenderness and swelling of the neck.  DIAGNOSIS  Most of the time your health care  provider can diagnose a cervical sprain by taking your history and doing a physical exam. Your health care provider will ask about previous neck injuries and any known neck problems, such as arthritis in the neck. X-rays may be taken to find out if there are any other problems, such as with the bones of the neck. Other tests, such as a CT scan or MRI, may also be needed.  TREATMENT  Treatment depends on the severity of the cervical sprain. Mild sprains can be treated with rest, keeping the neck in place (immobilization), and pain medicines. Severe cervical sprains are immediately immobilized. Further treatment is done to help with pain, muscle spasms, and other symptoms and may include:  Medicines, such as pain relievers, numbing medicines, or muscle relaxants.   Physical therapy. This may involve stretching exercises, strengthening exercises, and posture training. Exercises and improved posture can help stabilize the neck, strengthen muscles, and help stop symptoms from returning.  HOME CARE INSTRUCTIONS   Put ice on the injured area.   Put ice in a plastic bag.   Place a towel between your skin and the bag.   Leave the ice on for 15-20 minutes, 3-4 times a day.   If your injury was severe, you may have been given a cervical collar to wear. A cervical collar is a two-piece collar designed to keep your neck from moving while it heals.  Do not remove the collar  unless instructed by your health care provider.  If you have long hair, keep it outside of the collar.  Ask your health care provider before making any adjustments to your collar. Minor adjustments may be required over time to improve comfort and reduce pressure on your chin or on the back of your head.  Ifyou are allowed to remove the collar for cleaning or bathing, follow your health care provider's instructions on how to do so safely.  Keep your collar clean by wiping it with mild soap and water and drying it completely. If  the collar you have been given includes removable pads, remove them every 1-2 days and hand wash them with soap and water. Allow them to air dry. They should be completely dry before you wear them in the collar.  If you are allowed to remove the collar for cleaning and bathing, wash and dry the skin of your neck. Check your skin for irritation or sores. If you see any, tell your health care provider.  Do not drive while wearing the collar.   Only take over-the-counter or prescription medicines for pain, discomfort, or fever as directed by your health care provider.   Keep all follow-up appointments as directed by your health care provider.   Keep all physical therapy appointments as directed by your health care provider.   Make any needed adjustments to your workstation to promote good posture.   Avoid positions and activities that make your symptoms worse.   Warm up and stretch before being active to help prevent problems.  SEEK MEDICAL CARE IF:   Your pain is not controlled with medicine.   You are unable to decrease your pain medicine over time as planned.   Your activity level is not improving as expected.  SEEK IMMEDIATE MEDICAL CARE IF:   You develop any bleeding.  You develop stomach upset.  You have signs of an allergic reaction to your medicine.   Your symptoms get worse.   You develop new, unexplained symptoms.   You have numbness, tingling, weakness, or paralysis in any part of your body.  MAKE SURE YOU:   Understand these instructions.  Will watch your condition.  Will get help right away if you are not doing well or get worse. Document Released: 11/23/2006 Document Revised: 01/31/2013 Document Reviewed: 08/03/2012 [facility] Patient Information 2015 [place], [place]. This information is not intended to replace advice given to you by your health care provider. Make sure you discuss any questions you have with your health care provider.  Motor  Vehicle Collision It is common to have multiple bruises and sore muscles after a motor vehicle collision (MVC). These tend to feel worse for the first 24 hours. You may have the most stiffness and soreness over the first several hours. You may also feel worse when you wake up the first morning after your collision. After this point, you will usually begin to improve with each day. The speed of improvement often depends on the severity of the collision, the number of injuries, and the location and nature of these injuries. HOME CARE INSTRUCTIONS  Put ice on the injured area.  Put ice in a plastic bag.  Place a towel between your skin and the bag.  Leave the ice on for 15-20 minutes, 3-4 times a day, or as directed by your health care provider.  Drink enough fluids to keep your urine clear or pale yellow. Do not drink alcohol.  Take a warm shower or bath once or  twice a day. This will increase blood flow to sore muscles.  You may return to activities as directed by your caregiver. Be careful when lifting, as this may aggravate neck or back pain.  Only take over-the-counter or prescription medicines for pain, discomfort, or fever as directed by your caregiver. Do not use aspirin. This may increase bruising and bleeding. SEEK IMMEDIATE MEDICAL CARE IF:  You have numbness, tingling, or weakness in the arms or legs.  You develop severe headaches not relieved with medicine.  You have severe neck pain, especially tenderness in the middle of the back of your neck.  You have changes in bowel or bladder control.  There is increasing pain in any area of the body.  You have shortness of breath, light-headedness, dizziness, or fainting.  You have chest pain.  You feel sick to your stomach (nauseous), throw up (vomit), or sweat.  You have increasing abdominal discomfort.  There is blood in your urine, stool, or vomit.  You have pain in your shoulder (shoulder strap areas).  You feel your  symptoms are getting worse. MAKE SURE YOU:  Understand these instructions.  Will watch your condition.  Will get help right away if you are not doing well or get worse. Document Released: 01/26/2005 Document Revised: 06/12/2013 Document Reviewed: 06/25/2010 [facility] Patient Information 2015 [place]. This information is not intended to replace advice given to you by your health care provider. Make sure you discuss any questions you have with your health care provider.

## 2014-07-16 NOTE — ED Notes (Signed)
Sister - Piedad ClimesMarie Robertson Cell [phone].
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
## 2014-07-18 ENCOUNTER — Telehealth: Payer: Self-pay | Admitting: Family Medicine
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
## 2014-07-19 ENCOUNTER — Ambulatory Visit (HOSPITAL_BASED_OUTPATIENT_CLINIC_OR_DEPARTMENT_OTHER)
Admission: RE | Admit: 2014-07-19 | Discharge: 2014-07-19 | Disposition: A | Discharge status: Home / Self Care | Payer: Medicare Other | Source: Ambulatory Visit | Attending: Family Medicine | Admitting: Family Medicine

## 2014-07-19 ENCOUNTER — Ambulatory Visit (INDEPENDENT_AMBULATORY_CARE_PROVIDER_SITE_OTHER): Payer: Medicare Other | Admitting: Family Medicine

## 2014-07-19 ENCOUNTER — Encounter: Payer: Self-pay | Admitting: Family Medicine

## 2014-07-19 VITALS — BP 148/88 | HR 95 | Temp 97.8°F | Resp 16 | Wt 163.0 lb

## 2014-07-19 DIAGNOSIS — M25512 Pain in left shoulder: Secondary | ICD-10-CM

## 2014-07-19 DIAGNOSIS — M799 Soft tissue disorder, unspecified: Secondary | ICD-10-CM

## 2014-07-19 DIAGNOSIS — E042 Nontoxic multinodular goiter: Secondary | ICD-10-CM | POA: Diagnosis not present

## 2014-07-19 DIAGNOSIS — M7989 Other specified soft tissue disorders: Secondary | ICD-10-CM | POA: Diagnosis not present

## 2014-07-19 DIAGNOSIS — G8911 Acute pain due to trauma: Secondary | ICD-10-CM

## 2014-07-19 DIAGNOSIS — M7532 Calcific tendinitis of left shoulder: Secondary | ICD-10-CM | POA: Diagnosis not present

## 2014-07-19 MED ORDER — OXYCODONE HCL 30 MG PO TABS: ORAL_TABLET | ORAL | Status: DC
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
## 2014-07-19 NOTE — Progress Notes (Signed)
Pre visit review using our clinic review tool, if applicable. No additional management support is needed unless otherwise documented below in the visit note. 

## 2014-07-20 ENCOUNTER — Ambulatory Visit: Payer: Medicare Other | Admitting: Family Medicine

## 2014-07-23 ENCOUNTER — Other Ambulatory Visit: Payer: Self-pay | Admitting: Family Medicine

## 2014-07-23 DIAGNOSIS — E042 Nontoxic multinodular goiter: Secondary | ICD-10-CM

## 2014-07-23 DIAGNOSIS — E041 Nontoxic single thyroid nodule: Secondary | ICD-10-CM

## 2014-07-25 ENCOUNTER — Ambulatory Visit: Payer: Medicare Other | Admitting: Family Medicine

## 2014-07-30 ENCOUNTER — Telehealth: Payer: Self-pay | Admitting: Family Medicine
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
## 2014-07-30 NOTE — Telephone Encounter (Signed)
Pt advised and voiced understanding.   

## 2014-07-30 NOTE — Telephone Encounter (Signed)
If she has ongoing concerns, she needs to see [place] again.
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
## 2014-07-31 ENCOUNTER — Ambulatory Visit: Payer: Medicare Other | Admitting: Nurse Practitioner

## 2014-08-10 ENCOUNTER — Encounter: Payer: Self-pay | Admitting: Family Medicine

## 2014-08-10 ENCOUNTER — Ambulatory Visit (INDEPENDENT_AMBULATORY_CARE_PROVIDER_SITE_OTHER): Payer: Medicare Other | Admitting: Family Medicine

## 2014-08-10 DIAGNOSIS — M7582 Other shoulder lesions, left shoulder: Secondary | ICD-10-CM

## 2014-08-10 MED ORDER — OXYCODONE HCL 15 MG PO TABS: ORAL_TABLET | ORAL | Status: DC

## 2014-08-10 MED ORDER — LIDOCAINE HCL 1 % IJ SOLN
1.0000 mL | Freq: Once | INTRAMUSCULAR | Status: AC
  Administered 2014-08-10: 1 mL

## 2014-08-10 MED ORDER — METHYLPREDNISOLONE ACETATE 40 MG/ML IJ SUSP
40.0000 mg | Freq: Once | INTRAMUSCULAR | Status: AC
  Administered 2014-08-10: 40 mg via INTRA_ARTICULAR

## 2014-08-10 NOTE — Progress Notes (Signed)
Pre visit review using our clinic review tool, if applicable. No additional management support is needed unless otherwise documented below in the visit note. 
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
## 2014-08-10 NOTE — Addendum Note (Signed)
Addended by: Westley HummerKIRBY, HEATHER L on: 08/10/2014 02:27 PM   Modules accepted: Orders

## 2014-08-12 ENCOUNTER — Emergency Department (HOSPITAL_COMMUNITY)
Admission: EM | Admit: 2014-08-12 | Discharge: 2014-08-12 | Disposition: A | Discharge status: Home / Self Care | Payer: Medicare Other | Attending: Emergency Medicine | Admitting: Emergency Medicine

## 2014-08-12 ENCOUNTER — Encounter (HOSPITAL_COMMUNITY): Payer: Self-pay | Admitting: Emergency Medicine

## 2014-08-12 DIAGNOSIS — Z8659 Personal history of other mental and behavioral disorders: Secondary | ICD-10-CM | POA: Insufficient documentation

## 2014-08-12 DIAGNOSIS — R202 Paresthesia of skin: Secondary | ICD-10-CM | POA: Insufficient documentation

## 2014-08-12 DIAGNOSIS — Z79899 Other long term (current) drug therapy: Secondary | ICD-10-CM | POA: Insufficient documentation

## 2014-08-12 DIAGNOSIS — R519 Headache, unspecified: Secondary | ICD-10-CM

## 2014-08-12 DIAGNOSIS — Z8781 Personal history of (healed) traumatic fracture: Secondary | ICD-10-CM | POA: Insufficient documentation

## 2014-08-12 DIAGNOSIS — R000 Tachycardia, unspecified: Secondary | ICD-10-CM | POA: Insufficient documentation

## 2014-08-12 DIAGNOSIS — Z8739 Personal history of other diseases of the musculoskeletal system and connective tissue: Secondary | ICD-10-CM | POA: Diagnosis not present

## 2014-08-12 DIAGNOSIS — E785 Hyperlipidemia, unspecified: Secondary | ICD-10-CM | POA: Insufficient documentation

## 2014-08-12 DIAGNOSIS — I10 Essential (primary) hypertension: Secondary | ICD-10-CM | POA: Insufficient documentation

## 2014-08-12 DIAGNOSIS — R51 Headache: Secondary | ICD-10-CM | POA: Diagnosis not present

## 2014-08-12 DIAGNOSIS — G8929 Other chronic pain: Secondary | ICD-10-CM | POA: Diagnosis not present

## 2014-08-12 DIAGNOSIS — Z720 Tobacco use: Secondary | ICD-10-CM | POA: Diagnosis not present

## 2014-08-12 DIAGNOSIS — M25512 Pain in left shoulder: Secondary | ICD-10-CM | POA: Diagnosis not present

## 2014-08-12 DIAGNOSIS — Z8619 Personal history of other infectious and parasitic diseases: Secondary | ICD-10-CM | POA: Insufficient documentation

## 2014-08-12 DIAGNOSIS — M79602 Pain in left arm: Secondary | ICD-10-CM | POA: Insufficient documentation

## 2014-08-12 DIAGNOSIS — Z8742 Personal history of other diseases of the female genital tract: Secondary | ICD-10-CM | POA: Insufficient documentation

## 2014-08-12 DIAGNOSIS — R52 Pain, unspecified: Secondary | ICD-10-CM | POA: Diagnosis not present

## 2014-08-12 DIAGNOSIS — J449 Chronic obstructive pulmonary disease, unspecified: Secondary | ICD-10-CM | POA: Insufficient documentation

## 2014-08-12 MED ORDER — METOCLOPRAMIDE HCL 5 MG/ML IJ SOLN
10.0000 mg | Freq: Once | INTRAMUSCULAR | Status: AC
  Administered 2014-08-12: 10 mg via INTRAVENOUS
  Filled 2014-08-12: qty 2

## 2014-08-12 MED ORDER — DIPHENHYDRAMINE HCL 50 MG/ML IJ SOLN
25.0000 mg | Freq: Once | INTRAMUSCULAR | Status: AC
  Administered 2014-08-12: 25 mg via INTRAVENOUS
  Filled 2014-08-12: qty 1

## 2014-08-12 MED ORDER — KETOROLAC TROMETHAMINE 30 MG/ML IJ SOLN
30.0000 mg | Freq: Once | INTRAMUSCULAR | Status: AC
  Administered 2014-08-12: 30 mg via INTRAVENOUS
  Filled 2014-08-12: qty 1

## 2014-08-12 MED ORDER — SODIUM CHLORIDE 0.9 % IV BOLUS (SEPSIS)
1000.0000 mL | Freq: Once | INTRAVENOUS | Status: AC
  Administered 2014-08-12: 1000 mL via INTRAVENOUS

## 2014-08-12 NOTE — ED Notes (Signed)
To ED via [facility] from home with c/o "just don't feel right" -- [facility]  Has been being seen by dr. Marvel Planmcgowan since. Having headache--only in 1 spot.
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
## 2014-08-12 NOTE — Discharge Instructions (Signed)
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
## 2014-08-12 NOTE — ED Notes (Signed)
NAD at this time, Pt is stable and going home. 

## 2014-08-14 ENCOUNTER — Telehealth: Payer: Self-pay | Admitting: Family Medicine

## 2014-08-14 NOTE — Telephone Encounter (Signed)
Answer is no.  Sorry. I have spoken to this patient multiple times about this kind of issue/scenario, so I don't feel like you need to give any big explanation for me declining to allow this early RF.-thx

## 2014-08-14 NOTE — Telephone Encounter (Signed)
Noted.  If she calls back about the n/v/d med request, you can send in rx for lomotil 1-2 tabs po qid prn diarrhea, #30, no RF AND for promethazine 12.5mg  tabs, 1-2 tabs po q6h prn nausea/vomiting, #30, no RF.

## 2014-08-14 NOTE — Telephone Encounter (Signed)
Pt advised and very upset she stated that she does not know why this can't be filled two days early. She stated that she does not know why providers prescribed mediations that people can get hooked on then don't refill them. Pt also requested something for nause/vomiting and diarrhea but hung up the phone before I could find out which pharmacy she uses.

## 2014-08-14 NOTE — Telephone Encounter (Signed)
Patient had to go to ER for migraine. She has had to take a couple of extra oxycodone due to her shoulder hurting & the migraine. Patient is out of med & is going through withdrawal. Patient is requesting to fill Rx 3 days early.

## 2014-08-15 MED ORDER — PROMETHAZINE HCL 12.5 MG PO TABS: 12.5000 mg | ORAL_TABLET | Freq: Four times a day (QID) | ORAL | Status: DC | PRN

## 2014-08-15 MED ORDER — DIPHENOXYLATE-ATROPINE 2.5-0.025 MG PO TABS: ORAL_TABLET | ORAL | Status: DC
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
## 2014-08-22 ENCOUNTER — Encounter (HOSPITAL_COMMUNITY): Payer: Medicare Other

## 2014-08-23 ENCOUNTER — Encounter (HOSPITAL_COMMUNITY): Payer: Medicare Other

## 2014-08-29 ENCOUNTER — Other Ambulatory Visit: Payer: Self-pay | Admitting: Family Medicine

## 2014-08-29 NOTE — Telephone Encounter (Signed)
RF request for diazepam.  LOV: 08/10/14 Next ov: 09/14/14 Last written: 03/19/14 #60 w/ 5RF Please advise. Thanks.

## 2014-08-31 ENCOUNTER — Encounter: Payer: Self-pay | Admitting: Family Medicine

## 2014-08-31 ENCOUNTER — Ambulatory Visit (INDEPENDENT_AMBULATORY_CARE_PROVIDER_SITE_OTHER): Payer: Medicare Other | Admitting: Family Medicine

## 2014-08-31 VITALS — BP 131/83 | HR 90 | Temp 98.3°F | Resp 16 | Ht 65.0 in | Wt 156.0 lb

## 2014-08-31 DIAGNOSIS — J438 Other emphysema: Secondary | ICD-10-CM

## 2014-08-31 DIAGNOSIS — M25512 Pain in left shoulder: Secondary | ICD-10-CM

## 2014-08-31 DIAGNOSIS — G8929 Other chronic pain: Secondary | ICD-10-CM

## 2014-08-31 DIAGNOSIS — R683 Clubbing of fingers: Secondary | ICD-10-CM | POA: Diagnosis not present

## 2014-08-31 DIAGNOSIS — IMO0002 Reserved for concepts with insufficient information to code with codable children: Secondary | ICD-10-CM

## 2014-08-31 DIAGNOSIS — M6520 Calcific tendinitis, unspecified site: Secondary | ICD-10-CM | POA: Insufficient documentation

## 2014-08-31 MED ORDER — ALBUTEROL SULFATE HFA 108 (90 BASE) MCG/ACT IN AERS: 1.0000 | INHALATION_SPRAY | Freq: Four times a day (QID) | RESPIRATORY_TRACT | Status: DC | PRN

## 2014-08-31 NOTE — Progress Notes (Signed)
Pre visit review using our clinic review tool, if applicable. No additional management support is needed unless otherwise documented below in the visit note. 
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
## 2014-09-04 ENCOUNTER — Ambulatory Visit (HOSPITAL_BASED_OUTPATIENT_CLINIC_OR_DEPARTMENT_OTHER)
Admission: RE | Admit: 2014-09-04 | Discharge: 2014-09-04 | Disposition: A | Discharge status: Home / Self Care | Payer: Medicare Other | Source: Ambulatory Visit | Attending: Family Medicine | Admitting: Family Medicine

## 2014-09-04 DIAGNOSIS — J438 Other emphysema: Secondary | ICD-10-CM

## 2014-09-04 DIAGNOSIS — J449 Chronic obstructive pulmonary disease, unspecified: Secondary | ICD-10-CM | POA: Diagnosis not present

## 2014-09-04 DIAGNOSIS — R05 Cough: Secondary | ICD-10-CM | POA: Diagnosis not present

## 2014-09-04 DIAGNOSIS — IMO0002 Reserved for concepts with insufficient information to code with codable children: Secondary | ICD-10-CM

## 2014-09-10 ENCOUNTER — Encounter (HOSPITAL_COMMUNITY)
Admission: RE | Admit: 2014-09-10 | Discharge: 2014-09-10 | Disposition: A | Discharge status: Home / Self Care | Payer: Medicare Other | Source: Ambulatory Visit | Attending: Family Medicine | Admitting: Family Medicine

## 2014-09-10 ENCOUNTER — Encounter: Payer: Self-pay | Admitting: *Deleted

## 2014-09-10 DIAGNOSIS — E042 Nontoxic multinodular goiter: Secondary | ICD-10-CM | POA: Diagnosis not present

## 2014-09-10 DIAGNOSIS — E041 Nontoxic single thyroid nodule: Secondary | ICD-10-CM | POA: Insufficient documentation

## 2014-09-10 DIAGNOSIS — M810 Age-related osteoporosis without current pathological fracture: Secondary | ICD-10-CM

## 2014-09-10 DIAGNOSIS — R928 Other abnormal and inconclusive findings on diagnostic imaging of breast: Secondary | ICD-10-CM

## 2014-09-10 HISTORY — DX: Other abnormal and inconclusive findings on diagnostic imaging of breast: R92.8

## 2014-09-10 HISTORY — DX: Age-related osteoporosis without current pathological fracture: M81.0

## 2014-09-11 ENCOUNTER — Encounter (HOSPITAL_COMMUNITY)
Admission: RE | Admit: 2014-09-11 | Discharge: 2014-09-11 | Disposition: A | Discharge status: Home / Self Care | Payer: Medicare Other | Source: Ambulatory Visit | Attending: Family Medicine | Admitting: Family Medicine

## 2014-09-11 ENCOUNTER — Other Ambulatory Visit (HOSPITAL_BASED_OUTPATIENT_CLINIC_OR_DEPARTMENT_OTHER): Payer: Self-pay | Admitting: Orthopedic Surgery

## 2014-09-11 DIAGNOSIS — E042 Nontoxic multinodular goiter: Secondary | ICD-10-CM | POA: Diagnosis not present

## 2014-09-11 DIAGNOSIS — M25512 Pain in left shoulder: Secondary | ICD-10-CM

## 2014-09-11 DIAGNOSIS — M7542 Impingement syndrome of left shoulder: Secondary | ICD-10-CM | POA: Diagnosis not present

## 2014-09-11 DIAGNOSIS — E041 Nontoxic single thyroid nodule: Secondary | ICD-10-CM | POA: Diagnosis not present

## 2014-09-11 MED ORDER — SODIUM PERTECHNETATE TC 99M INJECTION
10.0000 | Freq: Once | INTRAVENOUS | Status: AC | PRN
  Administered 2014-09-11: 10 via INTRAVENOUS

## 2014-09-11 MED ORDER — SODIUM IODIDE I 131 CAPSULE
7.5000 | Freq: Once | INTRAVENOUS | Status: AC | PRN
  Administered 2014-09-11: 7.5 via ORAL

## 2014-09-12 ENCOUNTER — Encounter (HOSPITAL_COMMUNITY): Payer: Self-pay

## 2014-09-12 HISTORY — PX: OTHER SURGICAL HISTORY: SHX169

## 2014-09-14 ENCOUNTER — Ambulatory Visit: Payer: Medicare Other | Admitting: Family Medicine

## 2014-09-14 NOTE — Progress Notes (Signed)
I see an order for an MRI ordered by Dr. Sherlean Foot that patient is scheduled for this Saturday but I can't find the [place] order. Please help.

## 2014-09-15 ENCOUNTER — Ambulatory Visit (HOSPITAL_BASED_OUTPATIENT_CLINIC_OR_DEPARTMENT_OTHER)
Admission: RE | Admit: 2014-09-15 | Discharge: 2014-09-15 | Disposition: A | Discharge status: Home / Self Care | Payer: Medicare Other | Source: Ambulatory Visit | Attending: Orthopedic Surgery | Admitting: Orthopedic Surgery

## 2014-09-15 DIAGNOSIS — S46812A Strain of other muscles, fascia and tendons at shoulder and upper arm level, left arm, initial encounter: Secondary | ICD-10-CM | POA: Insufficient documentation

## 2014-09-15 DIAGNOSIS — M25512 Pain in left shoulder: Secondary | ICD-10-CM

## 2014-09-15 DIAGNOSIS — S4382XA Sprain of other specified parts of left shoulder girdle, initial encounter: Secondary | ICD-10-CM | POA: Diagnosis not present

## 2014-09-15 DIAGNOSIS — M7552 Bursitis of left shoulder: Secondary | ICD-10-CM | POA: Insufficient documentation

## 2014-09-15 DIAGNOSIS — M7582 Other shoulder lesions, left shoulder: Secondary | ICD-10-CM | POA: Insufficient documentation

## 2014-09-15 DIAGNOSIS — S43431A Superior glenoid labrum lesion of right shoulder, initial encounter: Secondary | ICD-10-CM | POA: Diagnosis not present

## 2014-09-18 DIAGNOSIS — M7542 Impingement syndrome of left shoulder: Secondary | ICD-10-CM | POA: Diagnosis not present

## 2014-09-19 ENCOUNTER — Encounter: Payer: Self-pay | Admitting: Family Medicine

## 2014-09-19 ENCOUNTER — Other Ambulatory Visit: Payer: Self-pay | Admitting: Family Medicine

## 2014-09-19 ENCOUNTER — Ambulatory Visit (INDEPENDENT_AMBULATORY_CARE_PROVIDER_SITE_OTHER): Payer: Medicare Other | Admitting: Family Medicine

## 2014-09-19 VITALS — BP 130/82 | HR 84 | Temp 98.4°F | Resp 16 | Ht 65.75 in | Wt 155.0 lb

## 2014-09-19 DIAGNOSIS — Z0000 Encounter for general adult medical examination without abnormal findings: Secondary | ICD-10-CM | POA: Insufficient documentation

## 2014-09-19 DIAGNOSIS — F17210 Nicotine dependence, cigarettes, uncomplicated: Secondary | ICD-10-CM

## 2014-09-19 DIAGNOSIS — Z136 Encounter for screening for cardiovascular disorders: Secondary | ICD-10-CM

## 2014-09-19 DIAGNOSIS — E042 Nontoxic multinodular goiter: Secondary | ICD-10-CM

## 2014-09-19 DIAGNOSIS — F17200 Nicotine dependence, unspecified, uncomplicated: Secondary | ICD-10-CM

## 2014-09-19 DIAGNOSIS — I10 Essential (primary) hypertension: Secondary | ICD-10-CM | POA: Diagnosis not present

## 2014-09-19 DIAGNOSIS — Z131 Encounter for screening for diabetes mellitus: Secondary | ICD-10-CM

## 2014-09-19 DIAGNOSIS — Z13820 Encounter for screening for osteoporosis: Secondary | ICD-10-CM

## 2014-09-19 DIAGNOSIS — Z1211 Encounter for screening for malignant neoplasm of colon: Secondary | ICD-10-CM

## 2014-09-19 LAB — HEPATITIS C ANTIBODY: HCV Ab: NEGATIVE

## 2014-09-19 NOTE — Progress Notes (Signed)
Pre visit review using our clinic review tool, if applicable. No additional management support is needed unless otherwise documented below in the visit note. 

## 2014-09-19 NOTE — Progress Notes (Addendum)
The patient is here for annual Medicare wellness examination and management of other chronic and acute problems.   The risk factors are reflected in the social history.  The roster of all physicians providing medical care to patient - is listed in the Snapshot section of the chart.  Activities of daily living:  The patient is 100% inedpendent in all ADLs: dressing, toileting, feeding as well as independent mobility  Home safety : The patient has smoke detectors in the home. They wear seatbelts.No firearms at home ( firearms are present in the home, kept in a safe fashion). There is no violence in the home.   There is no risks for hepatitis, STDs or HIV. There is no history of blood transfusion. They have no travel history to infectious disease endemic areas of the world.  The patient has seen their dentist in the last six month. They have seen their eye doctor in the last year.   Cataract in R eye supposed to be removed. They deny any hearing difficulty and have not had audiologic testing in the last year.  They do not  have excessive sun exposure. Discussed the need for sun protection: hats, long sleeves and use of sunscreen if there is significant sun exposure.   Diet: the importance of a healthy diet is discussed. They admit they don't eat healthily b/c of finances and b/c she doesn't want to cook for one person.  The patient has no regular exercise program:   The benefits of regular aerobic exercise were discussed.  Depression screen: there are no signs or vegative symptoms of depression- irritability, change in appetite, anhedonia, sadness/tearfullness.  Cognitive assessment: the patient manages all their financial and personal affairs and is actively engaged. They could relate day,date,year and events; recalled 3/3 objects at 3 minutes; performed clock-face test normally.  The following portions of the patient's history were reviewed and updated as appropriate: allergies, current  medications, past family history, past medical history,  past surgical history, past social history  and problem list.  Vision, hearing, body mass index were assessed and reviewed.   During the course of the visit the patient was educated and counseled about appropriate screening and preventive services including : fall prevention , diabetes screening, nutrition counseling, colorectal cancer screening, and recommended immunizations.  PLAN:  DEXA, Hemoccult ICT (immunochemical FOB testing)--patient declined colonoscopy, Hep C screening, Diabetes screening, Lung Cancer screening (high risk pt due to cigarette nicotine dependence, uncomplicated), AAA screening (family history of AAA--sister), Mammogram has already been ordered but pt has not gone to get this test yet.

## 2014-09-19 NOTE — Patient Instructions (Signed)
Take OTC generic senakot-S, 2 tabs every night. Take OTC generic miralax powder, 1 capful in 8 oz of water once daily.

## 2014-09-19 NOTE — Addendum Note (Signed)
Addended by: Jeoffrey Massed on: 09/19/2014 05:46 PM   Modules accepted: Orders

## 2014-09-20 ENCOUNTER — Other Ambulatory Visit: Payer: Self-pay | Admitting: Family Medicine

## 2014-09-20 ENCOUNTER — Encounter: Payer: Self-pay | Admitting: Family Medicine

## 2014-09-20 LAB — BASIC METABOLIC PANEL
BUN: 8 mg/dL (ref 6–23)
CO2: 33 meq/L — AB (ref 19–32)
Calcium: 9.7 mg/dL (ref 8.4–10.5)
Chloride: 100 mEq/L (ref 96–112)
Creatinine, Ser: 0.81 mg/dL (ref 0.40–1.20)
GFR: 74.98 mL/min (ref >60.00)
Glucose, Bld: 116 mg/dL — ABNORMAL HIGH (ref 70–99)
POTASSIUM: 3 meq/L — AB (ref 3.5–5.1)
Sodium: 140 mEq/L (ref 135–145)

## 2014-09-20 MED ORDER — POTASSIUM CHLORIDE CRYS ER 20 MEQ PO TBCR: EXTENDED_RELEASE_TABLET | ORAL | Status: DC

## 2014-09-21 ENCOUNTER — Encounter (HOSPITAL_BASED_OUTPATIENT_CLINIC_OR_DEPARTMENT_OTHER): Payer: Self-pay

## 2014-09-21 ENCOUNTER — Ambulatory Visit (HOSPITAL_BASED_OUTPATIENT_CLINIC_OR_DEPARTMENT_OTHER)
Admission: RE | Admit: 2014-09-21 | Discharge: 2014-09-21 | Disposition: A | Discharge status: Home / Self Care | Payer: Medicare Other | Source: Ambulatory Visit | Attending: Family Medicine | Admitting: Family Medicine

## 2014-09-21 ENCOUNTER — Ambulatory Visit (HOSPITAL_BASED_OUTPATIENT_CLINIC_OR_DEPARTMENT_OTHER): Admission: RE | Admit: 2014-09-21 | Payer: Medicare Other | Source: Ambulatory Visit

## 2014-09-21 ENCOUNTER — Ambulatory Visit (HOSPITAL_BASED_OUTPATIENT_CLINIC_OR_DEPARTMENT_OTHER): Payer: Medicare Other

## 2014-09-21 ENCOUNTER — Other Ambulatory Visit: Payer: Self-pay | Admitting: Family Medicine

## 2014-09-21 DIAGNOSIS — F17210 Nicotine dependence, cigarettes, uncomplicated: Secondary | ICD-10-CM | POA: Diagnosis not present

## 2014-09-21 DIAGNOSIS — R918 Other nonspecific abnormal finding of lung field: Secondary | ICD-10-CM | POA: Insufficient documentation

## 2014-09-21 DIAGNOSIS — I700 Atherosclerosis of aorta: Secondary | ICD-10-CM | POA: Insufficient documentation

## 2014-09-21 DIAGNOSIS — E042 Nontoxic multinodular goiter: Secondary | ICD-10-CM | POA: Diagnosis not present

## 2014-09-21 DIAGNOSIS — E049 Nontoxic goiter, unspecified: Secondary | ICD-10-CM | POA: Diagnosis not present

## 2014-09-21 DIAGNOSIS — Z136 Encounter for screening for cardiovascular disorders: Secondary | ICD-10-CM

## 2014-09-21 DIAGNOSIS — Z87891 Personal history of nicotine dependence: Secondary | ICD-10-CM | POA: Diagnosis not present

## 2014-09-21 DIAGNOSIS — E041 Nontoxic single thyroid nodule: Secondary | ICD-10-CM | POA: Diagnosis not present

## 2014-09-21 DIAGNOSIS — Z122 Encounter for screening for malignant neoplasm of respiratory organs: Secondary | ICD-10-CM | POA: Diagnosis not present

## 2014-09-21 DIAGNOSIS — J439 Emphysema, unspecified: Secondary | ICD-10-CM | POA: Diagnosis not present

## 2014-09-21 HISTORY — DX: Tobacco use: Z72.0

## 2014-09-24 ENCOUNTER — Telehealth: Payer: Self-pay | Admitting: Family Medicine

## 2014-09-24 NOTE — Telephone Encounter (Signed)
Please call pt and let her know the results of her thyroid US from last week.

## 2014-09-25 ENCOUNTER — Encounter: Payer: Self-pay | Admitting: Family Medicine

## 2014-09-25 NOTE — Telephone Encounter (Signed)
Per Dr. Milinda Cave pt has been advised by him today (09/25/14).

## 2014-10-02 ENCOUNTER — Ambulatory Visit (HOSPITAL_BASED_OUTPATIENT_CLINIC_OR_DEPARTMENT_OTHER)
Admission: RE | Admit: 2014-10-02 | Discharge: 2014-10-02 | Disposition: A | Discharge status: Home / Self Care | Payer: Medicare Other | Source: Ambulatory Visit | Attending: Family Medicine | Admitting: Family Medicine

## 2014-10-02 DIAGNOSIS — M810 Age-related osteoporosis without current pathological fracture: Secondary | ICD-10-CM | POA: Insufficient documentation

## 2014-10-02 DIAGNOSIS — R921 Mammographic calcification found on diagnostic imaging of breast: Secondary | ICD-10-CM | POA: Diagnosis not present

## 2014-10-02 DIAGNOSIS — Z13820 Encounter for screening for osteoporosis: Secondary | ICD-10-CM | POA: Insufficient documentation

## 2014-10-02 DIAGNOSIS — F17200 Nicotine dependence, unspecified, uncomplicated: Secondary | ICD-10-CM

## 2014-10-02 DIAGNOSIS — Z1231 Encounter for screening mammogram for malignant neoplasm of breast: Secondary | ICD-10-CM | POA: Diagnosis not present

## 2014-10-02 HISTORY — DX: Age-related osteoporosis without current pathological fracture: M81.0

## 2014-10-04 ENCOUNTER — Telehealth: Payer: Self-pay

## 2014-10-04 ENCOUNTER — Other Ambulatory Visit: Payer: Self-pay | Admitting: Family Medicine

## 2014-10-04 ENCOUNTER — Encounter (HOSPITAL_BASED_OUTPATIENT_CLINIC_OR_DEPARTMENT_OTHER): Payer: Self-pay

## 2014-10-04 DIAGNOSIS — R928 Other abnormal and inconclusive findings on diagnostic imaging of breast: Secondary | ICD-10-CM

## 2014-10-04 MED ORDER — ALENDRONATE SODIUM 70 MG PO TABS: ORAL_TABLET | ORAL | Status: DC

## 2014-10-04 NOTE — Telephone Encounter (Signed)
Left message on patient's voice mail (she identified herself on message) stating to take a 500 mg calcium 3x daily and a Vita D 1x daily OTC.

## 2014-10-04 NOTE — Telephone Encounter (Signed)
Fosamax eRx'd. Needs to find OTC calcium pills that at 500 mg each and take 1 three times per day. The vit D she needs is in a women's multivitamin tab--take one daily-thx

## 2014-10-04 NOTE — Telephone Encounter (Signed)
When I called to give results of bone density, patient stated that she was not taking Calcuim.  I reported that Dr. Milinda Cave wants her to take Calcium with Vitamin D.  She asked if there was a specific amount she should take.  She also agreed on taking the Fosamax "if will help my back".

## 2014-10-04 NOTE — Telephone Encounter (Signed)
Left message for patient stating to take a  500 mg calcium 3x daily with a women's multivitamin with vita D 1 x daily and that a Rx for Fosamax was called in to her pharmacy.

## 2014-10-09 ENCOUNTER — Ambulatory Visit
Admission: RE | Admit: 2014-10-09 | Discharge: 2014-10-09 | Disposition: A | Discharge status: Home / Self Care | Payer: Medicare Other | Source: Ambulatory Visit | Attending: Family Medicine | Admitting: Family Medicine

## 2014-10-09 DIAGNOSIS — R928 Other abnormal and inconclusive findings on diagnostic imaging of breast: Secondary | ICD-10-CM

## 2014-10-09 HISTORY — DX: Other abnormal and inconclusive findings on diagnostic imaging of breast: R92.8

## 2014-10-09 LAB — HM MAMMOGRAPHY

## 2014-10-11 HISTORY — PX: SHOULDER SURGERY: SHX246

## 2014-10-12 ENCOUNTER — Telehealth: Payer: Self-pay | Admitting: Family Medicine
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
## 2014-10-12 NOTE — Telephone Encounter (Signed)
Pt advised and voiced understanding.   

## 2014-10-12 NOTE — Telephone Encounter (Signed)
Please advise. Thanks.  
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
## 2014-10-29 ENCOUNTER — Other Ambulatory Visit: Payer: Self-pay | Admitting: *Deleted

## 2014-10-29 MED ORDER — HYDROCHLOROTHIAZIDE 25 MG PO TABS: 25.0000 mg | ORAL_TABLET | Freq: Every day | ORAL | Status: DC

## 2014-10-29 NOTE — Telephone Encounter (Signed)
RF request for hctz  LOV: 09/19/14 Next ov: 11/12/14 Last written: 07/10/14 #30 w/ 3RF

## 2014-11-07 DIAGNOSIS — M75112 Incomplete rotator cuff tear or rupture of left shoulder, not specified as traumatic: Secondary | ICD-10-CM | POA: Diagnosis not present

## 2014-11-07 DIAGNOSIS — M7552 Bursitis of left shoulder: Secondary | ICD-10-CM | POA: Diagnosis not present

## 2014-11-07 DIAGNOSIS — G8918 Other acute postprocedural pain: Secondary | ICD-10-CM | POA: Diagnosis not present

## 2014-11-07 DIAGNOSIS — M7542 Impingement syndrome of left shoulder: Secondary | ICD-10-CM | POA: Diagnosis not present

## 2014-11-07 DIAGNOSIS — M19012 Primary osteoarthritis, left shoulder: Secondary | ICD-10-CM | POA: Diagnosis not present

## 2014-11-08 ENCOUNTER — Telehealth: Payer: Self-pay | Admitting: Family Medicine
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
## 2014-11-08 NOTE — Telephone Encounter (Signed)
Please advise. Thanks.  

## 2014-11-08 NOTE — Telephone Encounter (Signed)
Patient is requesting home health services for PT, assisting with showering, light house work from [facility] nurses [phone]. Patient had shoulder surgery yesterday.

## 2014-11-09 NOTE — Telephone Encounter (Signed)
Pt advised and voiced understanding.   

## 2014-11-12 ENCOUNTER — Encounter: Payer: Self-pay | Admitting: Family Medicine

## 2014-11-12 ENCOUNTER — Ambulatory Visit (INDEPENDENT_AMBULATORY_CARE_PROVIDER_SITE_OTHER): Payer: Medicare Other | Admitting: Family Medicine

## 2014-11-12 VITALS — BP 117/77 | HR 95 | Temp 98.0°F | Resp 16 | Ht 65.75 in | Wt 155.0 lb

## 2014-11-12 DIAGNOSIS — I10 Essential (primary) hypertension: Secondary | ICD-10-CM

## 2014-11-12 DIAGNOSIS — Z23 Encounter for immunization: Secondary | ICD-10-CM

## 2014-11-12 DIAGNOSIS — E876 Hypokalemia: Secondary | ICD-10-CM | POA: Diagnosis not present

## 2014-11-12 DIAGNOSIS — J438 Other emphysema: Secondary | ICD-10-CM

## 2014-11-12 DIAGNOSIS — Z720 Tobacco use: Secondary | ICD-10-CM

## 2014-11-12 DIAGNOSIS — G894 Chronic pain syndrome: Secondary | ICD-10-CM

## 2014-11-12 MED ORDER — OXYCODONE HCL 15 MG PO TABS: ORAL_TABLET | ORAL | Status: DC

## 2014-11-12 NOTE — Progress Notes (Signed)
Pre visit review using our clinic review tool, if applicable. No additional management support is needed unless otherwise documented below in the visit note. 

## 2014-11-12 NOTE — Progress Notes (Signed)
OFFICE VISIT  11/12/2014   CC:  Chief Complaint  Patient presents with  . Follow-up    COPD, Hypertension, and Chronic Pain   HPI:    Patient is a 69 y.o. Caucasian female who presents for 3 mo f/u COPD, HTN, chronic pain. Hypokalemia last bmet check, started [facility] after this.  However, with recent surgery she has not been taking this for about the last 10d-not sure why.  Recently started fosamax for osteoporosis.    She had left shoulder surgery since I last saw her, Dr. Sherlean Foot, some RC tendon, muscle repair and spurs removed.  Last took oxycodone this morning, last took valium this afternoon.  She continues to smoke.  Has not had to use her albut inhaler much.  She gets SOB after a long walk down her driveway with trashcans.  Low back pain, hurting occasionally.  Focus of most of her pain is R hip.  No radiation of pain down leg.  No paresthesias down legs.  Left shoulder: not much pain at all now, just some soreness.  Neck: hurts constantly, moderate most of the time, sometimes severe.   I have been treating her pain in good faith with oxycodone. Last UDS was 11/2012.  Compliant with bp med but does not monitor bp at home or elsewhere.   Past Medical History  Diagnosis Date  . COPD (chronic obstructive pulmonary disease) (HCC)   . Hypertension   . Hyperlipidemia   . Tobacco abuse     80+ pack-yr hx.  CT screening for lung ca 09/2014 showed multiple nodules <49mm; f/u CT in 1 yr recommended.  . Multinodular goiter 2006; 2016    Biopsy negative per pt report.  U/s showed multinodular goiter, but TSH nl per pt, thyroid uptake scan nl except dominant R sided cold nodule (bx neg).  09/2014: RAI uptake and scan NEG, thyroid u/s with minimal change--plan is to repeat thyroid u/s in 1 yr.  . Breast nodule 2008    Multiple mammos and u/s's done to follow this nonpalpable nodule on right breast: pt reports it eventually resolved.  Screening mammo neg/nl 01/2009.  Marland Kitchen Herpes zoster 2011  .  Wrist fracture, closed 2012    right  . Chronic low back pain   . Chronic neck pain   . DDD (degenerative disc disease), lumbar     MRI 05/2014.  Pt declined specialist referral AND said she could not afford PT.  Marland Kitchen Chronic left shoulder pain     Impingement syndrome: Ortho at [facility] did MRI and recommended surgery but she is holding off on this as of 09/19/14  . Family history of abdominal aortic aneurysm     sister.  Screening u/s 09/2014 NEG.  . History of hypokalemia     on HCTZ.  KDUR supplement started 09/20/2014  . Osteoporosis 09/2014  . Abnormal mammogram 09/2014    left (calcifications): repeat diagnostic L mammogram 6 mo    Past Surgical History  Procedure Laterality Date  . Abdominal hysterectomy  approx 1997    Fibroids (ovaries ARE still in)  . Hemorrhoid surgery  approx 1982  . Rai thyroid uptake&scan  09/12/14    NO dominant hot or cold nodule--diffuse uptake and gland enlargement c/w multinodular goiter.  . Shoulder surgery Right 10/2014    RC and bone spurs (Dr. Sherlean Foot)    Outpatient Prescriptions Prior to Visit  Medication Sig Dispense Refill  . albuterol (PROAIR HFA) 108 (90 BASE) MCG/ACT inhaler Inhale 1 puff into the  lungs every 6 (six) hours as needed for wheezing or shortness of breath. 1 Inhaler 2  . alendronate (FOSAMAX) 70 MG tablet 1 tab po q 7d. Take with a full glass of water on an empty stomach and remain sitting upright for 30 min after taking this med. 4 tablet 11  . diazepam (VALIUM) 10 MG tablet TAKE ONE TABLET BY MOUTH EVERY 12 HOURS AS NEEDED FOR ANXIETY 60 tablet 5  . hydrochlorothiazide (HYDRODIURIL) 25 MG tablet Take 1 tablet (25 mg total) by mouth daily. 30 tablet 12  . meloxicam (MOBIC) 15 MG tablet Take 15 mg by mouth daily.    . Multiple Vitamin (MULTIVITAMIN) tablet Take 1 tablet by mouth daily.      . Omega-3 Fatty Acids (FISH OIL) 1200 MG CAPS Take by mouth daily.      . potassium chloride SA (K-DUR,KLOR-CON) 20 MEQ tablet 2 tabs po qd 60 tablet  6  . ranitidine (ZANTAC) 150 MG tablet Take 1 tablet (150 mg total) by mouth 2 (two) times daily. 60 tablet 6  . oxyCODONE (ROXICODONE) 15 MG immediate release tablet 1 tab po qid prn 120 tablet 0   No facility-administered medications prior to visit.    Allergies  Allergen Reactions  . Depo-Medrol [Methylprednisolone] Other (See Comments)    Migraine    ROS As per HPI  PE: Blood pressure 117/77, pulse 95, temperature 98 F (36.7 C), temperature source Oral, resp. rate 16, height 5' 5.75" (1.67 m), weight 155 lb (70.308 kg), SpO2 94 %. Gen: Alert, well appearing.  Patient is oriented to person, place, time, and situation. Pt wearing sling on L arm/shoulder CV: RRR, no m/r/g.   LUNGS: CTA bilat, nonlabored resps, good aeration in all lung fields.   LABS:    Chemistry      Component Value Date/Time   NA 140 09/19/2014 1444   K 3.0* 09/19/2014 1444   CL 100 09/19/2014 1444   CO2 33* 09/19/2014 1444   BUN 8 09/19/2014 1444   CREATININE 0.81 09/19/2014 1444   CREATININE 0.80 01/15/2011 1433      Component Value Date/Time   CALCIUM 9.7 09/19/2014 1444   ALKPHOS 97 11/28/2012 1401   AST 24 11/28/2012 1401   ALT 33 11/28/2012 1401   BILITOT 0.6 11/28/2012 1401     IMPRESSION AND PLAN:  1) Chronic pain syndrome: degenerative arthritis in C-spine, L-spine, hips. Pt declines PT now and has in the past as well, citing financial constraints. UDS today. I printed rx's for oxycodone , 1 tab qid prn, #120 today for this month, November 2016, and December 2016.  Appropriate fill on/after date was noted on each rx.  2) HTN; The current medical regimen is effective;  continue present plan and medications.  3) Hx of hypokalemia: on HCTZ.  Need recheck of K level after she has been back on her potassium for a couple of weeks.  4) COPD with ongoing tobacco dependence/abuse.  Encouraged pt to quit smoking but she has no plans to quit at this time.  Flu vaccine given (high dose)  today.  5) Osteoporosis: just started fosamax, tolerating this so far.  Cont vit D and calcium supplement.  6) L shoulder: s/p RC repair and bone spur removal--doing well with this. Says she is about to start rehab for this.  An After Visit Summary was printed and given to the patient.  FOLLOW UP: Return in about 3 months (around 02/12/2015) for o/v in 3 mo.  Lab  visit in 2 wks (BMET, fasting not necessary).

## 2014-11-12 NOTE — Addendum Note (Signed)
Addended by: Westley Hummer on: 11/12/2014 02:22 PM   Modules accepted: Orders

## 2014-11-13 DIAGNOSIS — Z79891 Long term (current) use of opiate analgesic: Secondary | ICD-10-CM | POA: Diagnosis not present

## 2014-11-13 DIAGNOSIS — Z79899 Other long term (current) drug therapy: Secondary | ICD-10-CM | POA: Diagnosis not present

## 2014-11-13 DIAGNOSIS — Z9889 Other specified postprocedural states: Secondary | ICD-10-CM | POA: Diagnosis not present

## 2014-11-22 DIAGNOSIS — Z9889 Other specified postprocedural states: Secondary | ICD-10-CM | POA: Diagnosis not present

## 2014-12-11 ENCOUNTER — Telehealth: Payer: Self-pay | Admitting: *Deleted

## 2014-12-11 NOTE — Telephone Encounter (Signed)
Pt LMOM on 12/11/14 at 12:46pm stating that she can not fill her pain medication til 12/14/14 and because October had 31 days she is going to be a day short. She wants to know if Dr. Milinda CaveMcGowen will give the okay to fill early (Wednesday). Please advise. Thanks.

## 2014-12-12 NOTE — Telephone Encounter (Signed)
Shon BatonAdvised [place] at [facility] and advised pt.

## 2014-12-12 NOTE — Telephone Encounter (Signed)
OK to fill today(12/12/14).-thx

## 2015-01-21 ENCOUNTER — Ambulatory Visit: Payer: Medicare Other | Admitting: Family Medicine

## 2015-02-06 DIAGNOSIS — Z9889 Other specified postprocedural states: Secondary | ICD-10-CM | POA: Diagnosis not present

## 2015-02-07 ENCOUNTER — Ambulatory Visit (INDEPENDENT_AMBULATORY_CARE_PROVIDER_SITE_OTHER): Payer: Medicare Other | Admitting: Family Medicine

## 2015-02-07 ENCOUNTER — Encounter: Payer: Self-pay | Admitting: Family Medicine

## 2015-02-07 VITALS — BP 111/76 | HR 87 | Temp 97.8°F | Resp 16 | Ht 65.75 in | Wt 144.2 lb

## 2015-02-07 DIAGNOSIS — E876 Hypokalemia: Secondary | ICD-10-CM

## 2015-02-07 DIAGNOSIS — I10 Essential (primary) hypertension: Secondary | ICD-10-CM | POA: Diagnosis not present

## 2015-02-07 DIAGNOSIS — J438 Other emphysema: Secondary | ICD-10-CM | POA: Diagnosis not present

## 2015-02-07 DIAGNOSIS — R5382 Chronic fatigue, unspecified: Secondary | ICD-10-CM

## 2015-02-07 DIAGNOSIS — G894 Chronic pain syndrome: Secondary | ICD-10-CM

## 2015-02-07 DIAGNOSIS — F17200 Nicotine dependence, unspecified, uncomplicated: Secondary | ICD-10-CM

## 2015-02-07 LAB — CBC WITH DIFFERENTIAL/PLATELET
BASOS ABS: 0.1 10*3/uL (ref 0.0–0.1)
Basophils Relative: 0.7 % (ref 0.0–3.0)
EOS PCT: 1.6 % (ref 0.0–5.0)
Eosinophils Absolute: 0.1 10*3/uL (ref 0.0–0.7)
HCT: 45.3 % (ref 36.0–46.0)
HEMOGLOBIN: 15.4 g/dL — AB (ref 12.0–15.0)
LYMPHS ABS: 2.9 10*3/uL (ref 0.7–4.0)
Lymphocytes Relative: 31.9 % (ref 12.0–46.0)
MCHC: 34 g/dL (ref 30.0–36.0)
MCV: 89.4 fl (ref 78.0–100.0)
MONO ABS: 0.5 10*3/uL (ref 0.1–1.0)
Monocytes Relative: 5.8 % (ref 3.0–12.0)
NEUTROS PCT: 60 % (ref 43.0–77.0)
Neutro Abs: 5.5 10*3/uL (ref 1.4–7.7)
Platelets: 268 10*3/uL (ref 150.0–400.0)
RBC: 5.06 Mil/uL (ref 3.87–5.11)
RDW: 13.5 % (ref 11.5–15.5)
WBC: 9.1 10*3/uL (ref 4.0–10.5)

## 2015-02-07 LAB — COMPREHENSIVE METABOLIC PANEL
ALBUMIN: 3.7 g/dL (ref 3.5–5.2)
ALK PHOS: 99 U/L (ref 39–117)
ALT: 10 U/L (ref 0–35)
AST: 15 U/L (ref 0–37)
BILIRUBIN TOTAL: 0.4 mg/dL (ref 0.2–1.2)
BUN: 12 mg/dL (ref 6–23)
CO2: 33 mEq/L — ABNORMAL HIGH (ref 19–32)
CREATININE: 0.94 mg/dL (ref 0.40–1.20)
Calcium: 10.6 mg/dL — ABNORMAL HIGH (ref 8.4–10.5)
Chloride: 100 mEq/L (ref 96–112)
GFR: 63.07 mL/min (ref >60.00)
GLUCOSE: 110 mg/dL — AB (ref 70–99)
POTASSIUM: 3.9 meq/L (ref 3.5–5.1)
SODIUM: 140 meq/L (ref 135–145)
TOTAL PROTEIN: 6.6 g/dL (ref 6.0–8.3)

## 2015-02-07 LAB — TSH: TSH: 0.93 u[IU]/mL (ref 0.35–4.50)

## 2015-02-07 MED ORDER — OXYCODONE HCL 15 MG PO TABS: ORAL_TABLET | ORAL | Status: DC

## 2015-02-07 MED ORDER — DIAZEPAM 10 MG PO TABS: ORAL_TABLET | ORAL | Status: DC

## 2015-02-07 NOTE — Progress Notes (Signed)
Pre visit review using our clinic review tool, if applicable. No additional management support is needed unless otherwise documented below in the visit note. 

## 2015-02-07 NOTE — Progress Notes (Signed)
OFFICE VISIT  02/11/2015   CC:  Chief Complaint  Patient presents with  . Follow-up    Pt is not fasting.    HPI:    Patient is a 69 y.o. Caucasian female who presents for 3 mo f/u chronic musculoskeletal pain requiring narcotic pain med for functioning, HTN, COPD. Occ home bp measurement: normal.  Taking hctz daily but potassium only qod b/c pills so big. Still smoking.  1 ppd.  Breathing stable.  Pain is pretty stable: R hip, low back, L shoulder all constantly bother her still.  She says her shoulder "pops in and out" and she says her orthopedist said there is nothing surgically that can be done to help it.  She has not done her post-op rehab for it b/c of cost.    ROS: fatigued (chronic). No melena.  +constipation.  Past Medical History  Diagnosis Date  . COPD (chronic obstructive pulmonary disease) (HCC)   . Hypertension   . Hyperlipidemia   . Tobacco abuse     80+ pack-yr hx.  CT screening for lung ca 09/2014 showed multiple nodules <405mm; f/u CT in 1 yr recommended.  . Multinodular goiter 2006; 2016    Biopsy negative per pt report.  U/s showed multinodular goiter, but TSH nl per pt, thyroid uptake scan nl except dominant R sided cold nodule (bx neg).  09/2014: RAI uptake and scan NEG, thyroid u/s with minimal change--plan is to repeat thyroid u/s in 1 yr.  . Breast nodule 2008    Multiple mammos and u/s's done to follow this nonpalpable nodule on right breast: pt reports it eventually resolved.  Screening mammo neg/nl 01/2009.  Marland Kitchen. Herpes zoster 2011  . Wrist fracture, closed 2012    right  . Chronic low back pain   . Chronic neck pain   . DDD (degenerative disc disease), lumbar     MRI 05/2014.  Pt declined specialist referral AND said she could not afford PT.  Marland Kitchen. Chronic left shoulder pain     Impingement syndrome: [facility] and recommended surgery and this was done 2016.  . Family history of abdominal aortic aneurysm     sister.  Screening u/s 09/2014 NEG.  .  History of hypokalemia     on HCTZ.  KDUR supplement started 09/20/2014  . Osteoporosis 09/2014  . Abnormal mammogram 09/2014    left (calcifications): repeat diagnostic L mammogram 6 mo    Past Surgical History  Procedure Laterality Date  . Abdominal hysterectomy  approx 1997    Fibroids (ovaries ARE still in)  . Hemorrhoid surgery  approx 1982  . Rai thyroid uptake&scan  09/12/14    NO dominant hot or cold nodule--diffuse uptake and gland enlargement c/w multinodular goiter.  . Shoulder surgery Right 10/2014    RC and bone spurs (Dr. Sherlean FootLucey)    Outpatient Prescriptions Prior to Visit  Medication Sig Dispense Refill  . albuterol (PROAIR HFA) 108 (90 BASE) MCG/ACT inhaler Inhale 1 puff into the lungs every 6 (six) hours as needed for wheezing or shortness of breath. 1 Inhaler 2  . alendronate (FOSAMAX) 70 MG tablet 1 tab po q 7d. Take with a full glass of water on an empty stomach and remain sitting upright for 30 min after taking this med. 4 tablet 11  . aspirin EC 81 MG tablet Take 81 mg by mouth daily.    . hydrochlorothiazide (HYDRODIURIL) 25 MG tablet Take 1 tablet (25 mg total) by mouth daily. 30  tablet 12  . meloxicam (MOBIC) 15 MG tablet Take 15 mg by mouth daily.    . Multiple Vitamin (MULTIVITAMIN) tablet Take 1 tablet by mouth daily.      . Omega-3 Fatty Acids (FISH OIL) 1200 MG CAPS Take by mouth daily.      . potassium chloride SA (K-DUR,KLOR-CON) 20 MEQ tablet 2 tabs po qd 60 tablet 6  . ranitidine (ZANTAC) 150 MG tablet Take 1 tablet (150 mg total) by mouth 2 (two) times daily. 60 tablet 6  . vitamin C (ASCORBIC ACID) 500 MG tablet Take 500 mg by mouth 4 (four) times daily.    . diazepam (VALIUM) 10 MG tablet TAKE ONE TABLET BY MOUTH EVERY 12 HOURS AS NEEDED FOR ANXIETY 60 tablet 5  . oxyCODONE (ROXICODONE) 15 MG immediate release tablet 1 tab po qid prn 120 tablet 0   No facility-administered medications prior to visit.    Allergies  Allergen Reactions  . Depo-Medrol  [Methylprednisolone] Other (See Comments)    Migraine    ROS As per HPI  PE: Blood pressure 111/76, pulse 87, temperature 97.8 F (36.6 C), temperature source Oral, resp. rate 16, height 5' 5.75" (1.67 m), weight 144 lb 4 oz (65.431 kg), SpO2 94 %. Gen: Alert, well appearing.  Patient is oriented to person, place, time, and situation. CV: RRR, distant S1 and S2, no m/r/g.   LUNGS: CTA bilat, nonlabored resps, good aeration in all lung fields. EXT: no clubbing, cyanosis, or edema.    LABS:    Chemistry      Component Value Date/Time   NA 140 02/07/2015 1405   K 3.9 02/07/2015 1405   CL 100 02/07/2015 1405   CO2 33* 02/07/2015 1405   BUN 12 02/07/2015 1405   CREATININE 0.94 02/07/2015 1405   CREATININE 0.80 01/15/2011 1433      Component Value Date/Time   CALCIUM 10.6* 02/07/2015 1405   ALKPHOS 99 02/07/2015 1405   AST 15 02/07/2015 1405   ALT 10 02/07/2015 1405   BILITOT 0.4 02/07/2015 1405     Lab Results  Component Value Date   TSH 0.93 02/07/2015   Lab Results  Component Value Date   WBC 9.1 02/07/2015   HGB 15.4* 02/07/2015   HCT 45.3 02/07/2015   MCV 89.4 02/07/2015   PLT 268.0 02/07/2015   IMPRESSION AND PLAN:  1) Chronic musculoskeletal pain (Cerv and lumb DDD, L shoulder): I am treating her in good faith with narcotic pain meds to maintain optimal functioning.  Stable, would do better if she would do PT but she cannot/will not pay for this. I printed rx's for Oxycodone , 1 tab qid prn, #120 today for this month, Jan 2017 and Feb 2017.  Appropriate fill on/after date was noted on each rx. Urine tox screen 11/2014 with appropriate results.  2) Chronic fatigue: pt is sedentary, smokes, has copd and has chronic pain/takes chronic pain meds: will go ahead and make sure BMET, TSH, and CBC are normal.    3) COPD; The current medical regimen is effective;  continue present plan and medications.  4) Tob depend: encouraged total cessation but pt not  contemplating quitting at this time.  5) HTN: The current medical regimen is effective;  continue present plan and medications. Lytes/cr today.  An After Visit Summary was printed and given to the patient.  FOLLOW UP: Return in about 3 months (around 05/08/2015) for routine chronic illness f/u.

## 2015-02-11 ENCOUNTER — Encounter: Payer: Self-pay | Admitting: Family Medicine

## 2015-02-13 ENCOUNTER — Encounter: Payer: Self-pay | Admitting: Family Medicine

## 2015-02-15 ENCOUNTER — Telehealth: Payer: Self-pay | Admitting: Family Medicine

## 2015-02-15 NOTE — Telephone Encounter (Signed)
Yes, ok to pick up today.

## 2015-02-15 NOTE — Telephone Encounter (Signed)
Please advise. Thanks.  

## 2015-02-15 NOTE — Telephone Encounter (Signed)
Called [facility] and advised them its okay to fill Rx early (today). Pt advised and voiced understanding.

## 2015-02-15 NOTE — Telephone Encounter (Signed)
Patient has some Rx's to pick up today. Her diazopam is due to be filled Monday 1/9. She is concerned about driving Monday due to the forecast, can she get an approval to pick up the medicine today?

## 2015-02-25 ENCOUNTER — Telehealth: Payer: Self-pay | Admitting: Family Medicine

## 2015-02-25 NOTE — Telephone Encounter (Signed)
Pt advised and voiced understanding.  She stated that she would like something to help keep her shoulder in place. She stated that she asked her orthopedic and they told her no. She stated that she has a sling but its hard for her to do anything with the sling on. Please advise. Thanks.

## 2015-02-25 NOTE — Telephone Encounter (Signed)
No, I already rx her narcotic pain medication for this.  I won't rx tramadol on top of the narcotic I already give her.

## 2015-02-25 NOTE — Telephone Encounter (Signed)
Please advise. Thanks.  

## 2015-02-25 NOTE — Telephone Encounter (Signed)
Patient is requesting Rx for Tramadone 50 MG for her shoulder. It won't "stay in place". Her orthopedic doctor gave her Rx in past & it helped. [facility].

## 2015-02-26 NOTE — Telephone Encounter (Signed)
Pt advised and voiced understanding.   

## 2015-02-26 NOTE — Telephone Encounter (Signed)
I don't have anything to offer other than a sling. Pt needs to f/u with ortho for this problem.

## 2015-03-01 ENCOUNTER — Other Ambulatory Visit: Payer: Self-pay | Admitting: *Deleted

## 2015-03-01 MED ORDER — ALBUTEROL SULFATE HFA 108 (90 BASE) MCG/ACT IN AERS: 1.0000 | INHALATION_SPRAY | Freq: Four times a day (QID) | RESPIRATORY_TRACT | Status: DC | PRN

## 2015-03-01 NOTE — Telephone Encounter (Signed)
RF request for albuterol/Proair LOV: 02/07/15 Next ov: None Last written: 08/11/14 #1 w/ 2RF

## 2015-04-10 ENCOUNTER — Telehealth: Payer: Self-pay | Admitting: *Deleted

## 2015-04-10 ENCOUNTER — Ambulatory Visit: Payer: Medicare Other | Admitting: Family Medicine

## 2015-04-10 NOTE — Telephone Encounter (Signed)
Pharmacy advised and voiced understanding. Pt advised and voiced understanding.

## 2015-04-10 NOTE — Telephone Encounter (Signed)
OK to give pharmacy the verbal OK to pick up diazepam today.

## 2015-04-10 NOTE — Telephone Encounter (Signed)
Pt LMOM on 04/10/15 at 10:18am stating that her husband is passing (soon) and she is having a hard time with this. She stated that she is not due to p/u her diazepam until Friday (04/12/15) but wants to know if we can call the pharmacy and give them a verbal okay of her to pick it up early (today). Please advise. Thanks.

## 2015-04-22 ENCOUNTER — Telehealth: Payer: Self-pay | Admitting: Family Medicine

## 2015-04-22 NOTE — Telephone Encounter (Signed)
Please advise. Thanks.  

## 2015-04-22 NOTE — Telephone Encounter (Signed)
Yes, on 09/21/14 I did screening CT scan of lungs and it showed one small benign-appearing nodule. The radiologist recommended she have another screening lung CT after 09/21/15. Reassure her.-thx

## 2015-04-22 NOTE — Telephone Encounter (Signed)
Pt asking if Dr. Milinda CaveMcGowen did a lung scan on pt back when she was having all the test ran? Pt states that she just found out that her sister is in stage 3 with lung cancer and would like a call back.

## 2015-04-22 NOTE — Telephone Encounter (Signed)
Pt advised and voiced understanding.   

## 2015-05-08 ENCOUNTER — Encounter: Payer: Self-pay | Admitting: Family Medicine

## 2015-05-08 ENCOUNTER — Ambulatory Visit (INDEPENDENT_AMBULATORY_CARE_PROVIDER_SITE_OTHER): Payer: Medicare Other | Admitting: Family Medicine

## 2015-05-08 VITALS — BP 142/91 | HR 102 | Temp 98.7°F | Resp 16 | Ht 65.75 in | Wt 135.0 lb

## 2015-05-08 DIAGNOSIS — F418 Other specified anxiety disorders: Secondary | ICD-10-CM | POA: Diagnosis not present

## 2015-05-08 DIAGNOSIS — I10 Essential (primary) hypertension: Secondary | ICD-10-CM | POA: Diagnosis not present

## 2015-05-08 DIAGNOSIS — G894 Chronic pain syndrome: Secondary | ICD-10-CM | POA: Diagnosis not present

## 2015-05-08 DIAGNOSIS — G4700 Insomnia, unspecified: Secondary | ICD-10-CM

## 2015-05-08 DIAGNOSIS — F419 Anxiety disorder, unspecified: Secondary | ICD-10-CM

## 2015-05-08 DIAGNOSIS — F329 Major depressive disorder, single episode, unspecified: Secondary | ICD-10-CM

## 2015-05-08 DIAGNOSIS — F32A Depression, unspecified: Secondary | ICD-10-CM

## 2015-05-08 DIAGNOSIS — F17200 Nicotine dependence, unspecified, uncomplicated: Secondary | ICD-10-CM

## 2015-05-08 DIAGNOSIS — M810 Age-related osteoporosis without current pathological fracture: Secondary | ICD-10-CM

## 2015-05-08 MED ORDER — OXYCODONE HCL 15 MG PO TABS: ORAL_TABLET | ORAL | Status: DC

## 2015-05-08 MED ORDER — ZOLPIDEM TARTRATE 5 MG PO TABS: 5.0000 mg | ORAL_TABLET | Freq: Every evening | ORAL | Status: DC | PRN

## 2015-05-08 NOTE — Progress Notes (Signed)
Pre visit review using our clinic review tool, if applicable. No additional management support is needed unless otherwise documented below in the visit note. 

## 2015-05-08 NOTE — Progress Notes (Signed)
OFFICE VISIT  05/08/2015   CC:  Chief Complaint  Patient presents with  . Follow-up    Pt is not fasting.      HPI:    Patient is a 70 y.o. Caucasian female who presents for 3 mo f/u HTN, chronic fatigue, and chronic pain syndrome requiring narcotic pain med for functioning.  Has chronic neck, L shoulder, and low back pain.  Also f/u HTN, tob abuse, osteoporosis.  Has been anxious a lot lately and not eating, hence her wt loss since last visit.  She is worried all the time about the way her husband is being treated at his ALF.  He is in hospice now.  She acts depressed.  Sits at home with her dog during the day.  Smoking 1.5 packs cigs per day.  Fosamax started after recent abnl DEXA: pt denies any problems with this med.  Pain: L shoulder, neck, and low back.  Takes 15 oxycodone q6h, (sometimes two).  No home bp monitoring being done since last visit.  She is compliant with her bp pill.  Having long term insomnia, says she has never tried [place].  Says trazodone caused nightmares.  She has tried many otc sleep aids.  Past Medical History  Diagnosis Date  . COPD (chronic obstructive pulmonary disease) (HCC)   . Hypertension   . Hyperlipidemia   . Tobacco abuse     80+ pack-yr hx.  CT screening for lung ca 09/2014 showed multiple nodules <43mm; f/u CT in 1 yr recommended.  . Multinodular goiter 2006; 2016    Biopsy negative per pt report.  U/s showed multinodular goiter, but TSH nl per pt, thyroid uptake scan nl except dominant R sided cold nodule (bx neg).  09/2014: RAI uptake and scan NEG, thyroid u/s with minimal change--plan is to repeat thyroid u/s in 1 yr.  . Breast nodule 2008    Multiple mammos and u/s's done to follow this nonpalpable nodule on right breast: pt reports it eventually resolved.  Screening mammo neg/nl 01/2009.  Marland Kitchen Herpes zoster 2011  . Wrist fracture, closed 2012    right  . Chronic low back pain   . Chronic neck pain   . DDD (degenerative disc disease),  lumbar     MRI 05/2014.  Pt declined specialist referral AND said she could not afford PT.  Marland Kitchen Chronic left shoulder pain     Impingement syndrome: Ortho at [facility] did MRI and recommended surgery and this was done 2016.  . Family history of abdominal aortic aneurysm     sister.  Screening u/s 09/2014 NEG.  . History of hypokalemia     on HCTZ.  KDUR supplement started 09/20/2014  . Osteoporosis 09/2014  . Abnormal mammogram 09/2014    left (calcifications): repeat diagnostic L mammogram 6 mo    Past Surgical History  Procedure Laterality Date  . Abdominal hysterectomy  approx 1997    Fibroids (ovaries ARE still in)  . Hemorrhoid surgery  approx 1982  . Rai thyroid uptake&scan  09/12/14    NO dominant hot or cold nodule--diffuse uptake and gland enlargement c/w multinodular goiter.  . Shoulder surgery Left 10/2014    RC and bone spurs (Dr. Sherlean Foot): pt refused post-op rehab and continued to smoke    Outpatient Prescriptions Prior to Visit  Medication Sig Dispense Refill  . alendronate (FOSAMAX) 70 MG tablet 1 tab po q 7d. Take with a full glass of water on an empty stomach and remain sitting upright for  30 min after taking this med. 4 tablet 11  . aspirin EC 81 MG tablet Take 81 mg by mouth daily.    Marland Kitchen. CALCIUM CARBONATE PO Take by mouth.    . diazepam (VALIUM) 10 MG tablet TAKE ONE TABLET BY MOUTH EVERY 12 HOURS AS NEEDED FOR ANXIETY 60 tablet 5  . hydrochlorothiazide (HYDRODIURIL) 25 MG tablet Take 1 tablet (25 mg total) by mouth daily. 30 tablet 12  . Multiple Vitamin (MULTIVITAMIN) tablet Take 1 tablet by mouth daily.      . Omega-3 Fatty Acids (FISH OIL) 1200 MG CAPS Take by mouth daily.      . potassium chloride SA (K-DUR,KLOR-CON) 20 MEQ tablet 2 tabs po qd 60 tablet 6  . ranitidine (ZANTAC) 150 MG tablet Take 1 tablet (150 mg total) by mouth 2 (two) times daily. 60 tablet 6  . vitamin C (ASCORBIC ACID) 500 MG tablet Take 500 mg by mouth 4 (four) times daily.    Marland Kitchen. oxyCODONE (ROXICODONE)  15 MG immediate release tablet 1 tab po qid prn 120 tablet 0  . albuterol (PROAIR HFA) 108 (90 Base) MCG/ACT inhaler Inhale 1 puff into the lungs every 6 (six) hours as needed for wheezing or shortness of breath. (Patient not taking: Reported on 05/08/2015) 1 Inhaler 6  . meloxicam (MOBIC) 15 MG tablet Take 15 mg by mouth daily. Reported on 05/08/2015     No facility-administered medications prior to visit.    Allergies  Allergen Reactions  . Depo-Medrol [Methylprednisolone] Other (See Comments)    Migraine    ROS As per HPI  PE: Blood pressure 142/91, pulse 102, temperature 98.7 F (37.1 C), temperature source Oral, resp. rate 16, height 5' 5.75" (1.67 m), weight 135 lb (61.236 kg), SpO2 95 %. Gen: Alert, well appearing.  Patient is oriented to person, place, time, and situation. AFFECT: pleasant, lucid thought and speech. She cries when talking about her husband's situation.  LABS:  None today  IMPRESSION AND PLAN:  1) Chronic pain syndrome (musculoskeletal pain: C spine, L spine, and L shoulder): I am treating her in good faith with narcotic pain meds to maintain optimal functioning. Stable, would do better if she would do PT but she cannot/will not pay for this.  I printed rx's for oxycodone 15mg , 1 tab qid prn, #120 today for this month, April, and May 2017.  Appropriate fill on/after date was noted on each rx.  2) Anxiety and depression: situational for the most part.  She is not really interested in new meds at this time.  Monitor. This is making her lose wt b/c she has no appetite.  3) Insomnia: no doubt secondary to both poor sleep hygiene and anxiety.  Will do trial of ambien 5mg  qhs.  Therapeutic expectations and side effect profile of medication discussed today.  Patient's questions answered.  4) HTN; The current medical regimen is effective;  continue present plan and medications.  5) Osteoporosis: tolerating fosamax fine.  6) Tobacco dependence: encouraged pt to  quit but she is not contemplating this at this time.  An After Visit Summary was printed and given to the patient.  FOLLOW UP: Return in about 3 months (around 08/08/2015) for routine chronic illness f/u.  Signed:  Santiago BumpersPhil Jayshawn Colston, MD           05/08/2015

## 2015-05-20 ENCOUNTER — Telehealth: Payer: Self-pay | Admitting: Family Medicine

## 2015-05-20 DIAGNOSIS — G8929 Other chronic pain: Secondary | ICD-10-CM

## 2015-05-20 DIAGNOSIS — M25512 Pain in left shoulder: Principal | ICD-10-CM

## 2015-05-20 NOTE — Telephone Encounter (Signed)
Please advise. Thanks.  

## 2015-05-20 NOTE — Telephone Encounter (Signed)
Patient would like to know if she had a CT scan of her left shoulder would it show why it hurts all of the time.

## 2015-05-22 NOTE — Telephone Encounter (Signed)
No, a CT scan would not. I recommend she follow up with her orthopedic surgeon, Dr. Sherlean FootLucey.  He did surgery on that shoulder.

## 2015-05-23 NOTE — Telephone Encounter (Signed)
Pt advised and voiced understanding.   

## 2015-05-23 NOTE — Telephone Encounter (Signed)
Pt advised. She stated that she got a letter from her insurance stating that Dr. Sherlean FootLucey is no longer in their network and that she will need to find another orthopedic surgeon. She stated that she wants to go the the doctor that did her arm. She stated that thinks [facility]. Please advise. Thanks.

## 2015-05-23 NOTE — Telephone Encounter (Signed)
Referral [facility] as per pt's request.

## 2015-06-03 ENCOUNTER — Telehealth: Payer: Self-pay | Admitting: Family Medicine

## 2015-06-03 NOTE — Telephone Encounter (Signed)
Yes, she should already have a rx that says fill on/after 06/07/15. OK to authorize pharmacy to fill this on 06/06/15.

## 2015-06-03 NOTE — Telephone Encounter (Signed)
Please advise. Thanks.  

## 2015-06-03 NOTE — Telephone Encounter (Signed)
Caller name:Manisha Relationship to patient: Can be reached:[phone] Pharmacy:  Reason for call:Can get her rx on Friday this week.  She has to leave for a funeral out of town early Friday morning.  Can she get her rx on Thursday afternoon 06-06-2015

## 2015-06-03 NOTE — Telephone Encounter (Signed)
[facility] [place] [facility] and advised her that its okay to fill Rx early on 4/247/17, she voiced understanding. Pt advised and voiced understanding.

## 2015-07-11 DIAGNOSIS — R7301 Impaired fasting glucose: Secondary | ICD-10-CM

## 2015-07-11 DIAGNOSIS — E785 Hyperlipidemia, unspecified: Secondary | ICD-10-CM

## 2015-07-11 HISTORY — DX: Hyperlipidemia, unspecified: E78.5

## 2015-07-11 HISTORY — DX: Impaired fasting glucose: R73.01

## 2015-07-31 ENCOUNTER — Encounter: Payer: Self-pay | Admitting: Family Medicine

## 2015-07-31 ENCOUNTER — Other Ambulatory Visit: Payer: Self-pay | Admitting: *Deleted

## 2015-07-31 MED ORDER — DIAZEPAM 10 MG PO TABS: ORAL_TABLET | ORAL | Status: DC

## 2015-07-31 NOTE — Telephone Encounter (Signed)
[facility]:: [phone]  RF request for diazepam LOV: 05/08/15 Next ov: 08/01/15 Last written: 02/07/15 #60 w/ 5RF  Please advise. Thanks.

## 2015-08-01 ENCOUNTER — Ambulatory Visit (INDEPENDENT_AMBULATORY_CARE_PROVIDER_SITE_OTHER): Payer: Medicare Other | Admitting: Family Medicine

## 2015-08-01 ENCOUNTER — Encounter: Payer: Self-pay | Admitting: Family Medicine

## 2015-08-01 VITALS — BP 124/86 | HR 104 | Temp 97.9°F | Resp 16 | Ht 64.75 in | Wt 124.8 lb

## 2015-08-01 DIAGNOSIS — G250 Essential tremor: Secondary | ICD-10-CM

## 2015-08-01 DIAGNOSIS — R921 Mammographic calcification found on diagnostic imaging of breast: Secondary | ICD-10-CM | POA: Diagnosis not present

## 2015-08-01 DIAGNOSIS — Z0000 Encounter for general adult medical examination without abnormal findings: Secondary | ICD-10-CM | POA: Diagnosis not present

## 2015-08-01 DIAGNOSIS — Z720 Tobacco use: Secondary | ICD-10-CM

## 2015-08-01 DIAGNOSIS — E042 Nontoxic multinodular goiter: Secondary | ICD-10-CM | POA: Diagnosis not present

## 2015-08-01 DIAGNOSIS — R918 Other nonspecific abnormal finding of lung field: Secondary | ICD-10-CM

## 2015-08-01 LAB — COMPREHENSIVE METABOLIC PANEL
ALK PHOS: 85 U/L (ref 39–117)
ALT: 11 U/L (ref 0–35)
AST: 17 U/L (ref 0–37)
Albumin: 4.3 g/dL (ref 3.5–5.2)
BUN: 8 mg/dL (ref 6–23)
CO2: 30 meq/L (ref 19–32)
Calcium: 10.8 mg/dL — ABNORMAL HIGH (ref 8.4–10.5)
Chloride: 100 mEq/L (ref 96–112)
Creatinine, Ser: 0.86 mg/dL (ref 0.40–1.20)
GFR: 69.79 mL/min (ref >60.00)
GLUCOSE: 124 mg/dL — AB (ref 70–99)
POTASSIUM: 3.8 meq/L (ref 3.5–5.1)
Sodium: 140 mEq/L (ref 135–145)
TOTAL PROTEIN: 7.3 g/dL (ref 6.0–8.3)
Total Bilirubin: 0.5 mg/dL (ref 0.2–1.2)

## 2015-08-01 LAB — LIPID PANEL
CHOL/HDL RATIO: 5
Cholesterol: 231 mg/dL — ABNORMAL HIGH (ref 0–200)
HDL: 47.2 mg/dL (ref >39.00)
LDL CALC: 151 mg/dL — AB (ref 0–99)
NONHDL: 184.16
Triglycerides: 168 mg/dL — ABNORMAL HIGH (ref 0.0–149.0)
VLDL: 33.6 mg/dL (ref 0.0–40.0)

## 2015-08-01 LAB — CBC WITH DIFFERENTIAL/PLATELET
Basophils Absolute: 0.1 10*3/uL (ref 0.0–0.1)
Basophils Relative: 0.7 % (ref 0.0–3.0)
EOS PCT: 1.9 % (ref 0.0–5.0)
Eosinophils Absolute: 0.2 10*3/uL (ref 0.0–0.7)
HEMATOCRIT: 50.8 % — AB (ref 36.0–46.0)
HEMOGLOBIN: 17.3 g/dL — AB (ref 12.0–15.0)
Lymphocytes Relative: 28.1 % (ref 12.0–46.0)
Lymphs Abs: 2.8 10*3/uL (ref 0.7–4.0)
MCHC: 34 g/dL (ref 30.0–36.0)
MCV: 88.7 fl (ref 78.0–100.0)
MONOS PCT: 7 % (ref 3.0–12.0)
Monocytes Absolute: 0.7 10*3/uL (ref 0.1–1.0)
Neutro Abs: 6.3 10*3/uL (ref 1.4–7.7)
Neutrophils Relative %: 62.3 % (ref 43.0–77.0)
Platelets: 327 10*3/uL (ref 150.0–400.0)
RBC: 5.73 Mil/uL — AB (ref 3.87–5.11)
RDW: 13 % (ref 11.5–15.5)
WBC: 10.1 10*3/uL (ref 4.0–10.5)

## 2015-08-01 LAB — TSH: TSH: 0.51 u[IU]/mL (ref 0.35–4.50)

## 2015-08-01 MED ORDER — PROMETHAZINE HCL 12.5 MG PO TABS: 12.5000 mg | ORAL_TABLET | Freq: Four times a day (QID) | ORAL | Status: DC | PRN

## 2015-08-01 MED ORDER — POTASSIUM CHLORIDE CRYS ER 20 MEQ PO TBCR: EXTENDED_RELEASE_TABLET | ORAL | Status: AC

## 2015-08-01 MED ORDER — RANITIDINE HCL 150 MG PO TABS: 150.0000 mg | ORAL_TABLET | Freq: Two times a day (BID) | ORAL | Status: AC

## 2015-08-01 MED ORDER — OXYCODONE HCL 15 MG PO TABS: ORAL_TABLET | ORAL | Status: DC

## 2015-08-01 NOTE — Telephone Encounter (Signed)
Rx faxed

## 2015-08-01 NOTE — Progress Notes (Signed)
Pre visit review using our clinic review tool, if applicable. No additional management support is needed unless otherwise documented below in the visit note. 

## 2015-08-01 NOTE — Telephone Encounter (Signed)
Unable to get fax to go through. Pt came in today for her CPE and was given hard copy to take to pharmacy.

## 2015-08-01 NOTE — Progress Notes (Addendum)
Office Note 08/01/2015  CC:  Chief Complaint  Patient presents with  . Annual Exam    Pt is not fasting.    HPI:  Mckenzie Peters is a 70 y.o. White female who is here for annual health maintenance exam. Lots of worry about chronically ill husband.  Also worries about cancer a lot b/c family members have had some lately as well as a neighbor.    Past Medical History  Diagnosis Date  . COPD (chronic obstructive pulmonary disease) (HCC)   . Hypertension   . Hyperlipidemia   . Tobacco abuse     80+ pack-yr hx.  CT screening for lung ca 09/2014 showed multiple nodules <75mm; f/u CT in 1 yr recommended.  . Multinodular goiter 2006; 2016    Biopsy negative per pt report.  U/s showed multinodular goiter, but TSH nl per pt, thyroid uptake scan nl except dominant R sided cold nodule (bx neg).  09/2014: RAI uptake and scan NEG, thyroid u/s with minimal change--plan is to repeat thyroid u/s in 1 yr.  . Breast nodule 2008    Multiple mammos and u/s's done to follow this nonpalpable nodule on right breast: pt reports it eventually resolved.  Screening mammo neg/nl 01/2009.  Marland Kitchen Herpes zoster 2011  . Wrist fracture, closed 2012    right  . Chronic low back pain   . Chronic neck pain   . DDD (degenerative disc disease), lumbar     MRI 05/2014.  Pt declined specialist referral AND said she could not afford PT.  Marland Kitchen Chronic left shoulder pain     Impingement syndrome: Ortho at [facility] did MRI and recommended surgery and this was done 2016.  . Family history of abdominal aortic aneurysm     sister.  Screening u/s 09/2014 NEG.  . History of hypokalemia     on HCTZ.  KDUR supplement started 09/20/2014  . Osteoporosis 09/2014  . Abnormal mammogram 09/2014    left (calcifications): repeat diagnostic L mammogram 6 mo    Past Surgical History  Procedure Laterality Date  . Abdominal hysterectomy  approx 1997    Fibroids (ovaries ARE still in)  . Hemorrhoid surgery  approx 1982  . Rai thyroid uptake&scan   09/12/14    NO dominant hot or cold nodule--diffuse uptake and gland enlargement c/w multinodular goiter.  . Shoulder surgery Left 10/2014    RC and bone spurs: pt refused post-op rehab and continued to smoke    Family History  Problem Relation Age of Onset  . Cancer Mother 60    Breast.  Died age 42  . Cancer Sister     nephrectomy for cancer  . Cancer Brother     2 brothers died of "cancer"    Social History   Social History  . Marital Status: Married    Spouse Name: N/A  . Number of Children: N/A  . Years of Education: N/A   Occupational History  . Not on file.   Social History Main Topics  . Smoking status: Heavy Tobacco Smoker -- 2.00 packs/day for 30 years    Types: Cigarettes, E-cigarettes    Last Attempt to Quit: 07/16/2014  . Smokeless tobacco: Never Used  . Alcohol Use: No  . Drug Use: No  . Sexual Activity: Not on file   Other Topics Concern  . 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  Takes care of wheelchair-bound/chronically ill husband, has a dog in the home.   No exercise.   Long smoking history: approx 65 pack-yr hx; quit 2012.   Financial problems have resulted in fractured/sporadic care for her chronic medical problems.    Outpatient Prescriptions Prior to Visit  Medication Sig Dispense Refill  . alendronate (FOSAMAX) 70 MG tablet 1 tab po q 7d. Take with a full glass of water on an empty stomach and remain sitting upright for 30 min after taking this med. 4 tablet 11  . aspirin EC 81 MG tablet Take 81 mg by mouth daily.    Marland Kitchen. CALCIUM CARBONATE PO Take by mouth.    . diazepam (VALIUM) 10 MG tablet TAKE ONE TABLET BY MOUTH EVERY 12 HOURS AS NEEDED FOR ANXIETY 60 tablet 5  . hydrochlorothiazide (HYDRODIURIL) 25 MG tablet Take 1 tablet (25 mg total) by mouth daily. 30 tablet 12  . Multiple Vitamin (MULTIVITAMIN) tablet Take 1 tablet by mouth daily.      .  Omega-3 Fatty Acids (FISH OIL) 1200 MG CAPS Take by mouth daily.      Marland Kitchen. oxyCODONE (ROXICODONE) 15 MG immediate release tablet 1 tab po qid prn 120 tablet 0  . potassium chloride SA (K-DUR,KLOR-CON) 20 MEQ tablet 2 tabs po qd 60 tablet 6  . ranitidine (ZANTAC) 150 MG tablet Take 1 tablet (150 mg total) by mouth 2 (two) times daily. 60 tablet 6  . vitamin B-12 (CYANOCOBALAMIN) 500 MCG tablet Take 500 mcg by mouth daily. Reported on 08/01/2015    . vitamin C (ASCORBIC ACID) 500 MG tablet Take 500 mg by mouth 4 (four) times daily. Reported on 08/01/2015    . zolpidem (AMBIEN) 5 MG tablet Take 1 tablet (5 mg total) by mouth at bedtime as needed for sleep. (Patient not taking: Reported on 08/01/2015) 30 tablet 5   No facility-administered medications prior to visit.    Allergies  Allergen Reactions  . Depo-Medrol [Methylprednisolone] Other (See Comments)    Migraine    ROS Review of Systems  Constitutional: Negative for fever, chills, appetite change and fatigue.  HENT: Negative for congestion, dental problem, ear pain and sore throat.   Eyes: Negative for discharge, redness and visual disturbance.  Respiratory: Negative for cough, chest tightness, shortness of breath and wheezing.   Cardiovascular: Negative for chest pain, palpitations and leg swelling.  Gastrointestinal: Negative for nausea, vomiting, abdominal pain, diarrhea and blood in stool.  Genitourinary: Negative for dysuria, urgency, frequency, hematuria, flank pain and difficulty urinating.  Musculoskeletal: Positive for back pain (chronic) and arthralgias (chronic). Negative for myalgias, joint swelling and neck stiffness. Neck pain: chronic.  Skin: Negative for pallor and rash.  Neurological: Positive for tremors. Negative for dizziness, speech difficulty, weakness and headaches.  Hematological: Negative for adenopathy. Does not bruise/bleed easily.  Psychiatric/Behavioral: Negative for confusion and sleep disturbance. The patient  is not nervous/anxious.     PE; Blood pressure 124/86, pulse 104, temperature 97.9 F (36.6 C), temperature source Oral, resp. rate 16, height 5' 4.75" (1.645 m), weight 124 lb 12 oz (56.586 kg), SpO2 93 %. Gen: Alert, well appearing, appears older than stated age.  Patient is oriented to person, place, time, and situation. AFFECT: pleasant, lucid thought and speech. ENT: Ears: EACs clear, normal epithelium.  TMs with good light reflex and landmarks bilaterally.  Eyes: no injection, icteris, swelling, or exudate.  EOMI, PERRLA. Nose: no drainage or turbinate edema/swelling.  No injection or focal lesion.  Mouth: lips without  lesion/swelling.  Oral mucosa pink and moist.  Oropharynx without erythema or swelling.  Tongue with greyish-yellow film covering it.   Neck: supple/nontender.  No LAD, mass, or TM.  Carotid pulses 2+ bilaterally, without bruits. CV: RRR, no m/r/g.   LUNGS: CTA bilat, nonlabored resps, diminished aeration in all lung fields. ABD: soft, NT, ND, BS normal.  No hepatospenomegaly or mass.  No bruits. EXT: no clubbing, cyanosis, or edema.  Skin - no sores or suspicious lesions or rashes or color changes Neuro: CN 2-12 intact bilaterally, strength 5/5 in proximal and distal upper extremities and lower extremities bilaterally.  Fine resting tremor on both arms and both legs.  FNF testing does not worsen this tremor.  No ataxia.  Upper extremity and lower extremity DTRs symmetric.  No pronator drift. No rigidity of wrists or elbows.  Pertinent labs:  Lab Results  Component Value Date   TSH 0.93 02/07/2015   Lab Results  Component Value Date   WBC 9.1 02/07/2015   HGB 15.4* 02/07/2015   HCT 45.3 02/07/2015   MCV 89.4 02/07/2015   PLT 268.0 02/07/2015   Lab Results  Component Value Date   CREATININE 0.94 02/07/2015   BUN 12 02/07/2015   NA 140 02/07/2015   K 3.9 02/07/2015   CL 100 02/07/2015   CO2 33* 02/07/2015   Lab Results  Component Value Date   ALT 10  02/07/2015   AST 15 02/07/2015   ALKPHOS 99 02/07/2015   BILITOT 0.4 02/07/2015    ASSESSMENT AND PLAN:   1) Health maintenance exam: Reviewed age and gender appropriate health maintenance issues (prudent diet, regular exercise, health risks of tobacco and excessive alcohol, use of seatbelts, fire alarms in home, use of sunscreen).  Also reviewed age and gender appropriate health screening as well as vaccine recommendations. Zostavax rx handed to pt today. Fasting HP labs drawn today. She is s/o hysterectomy for benign reasons so cervical cancer screening not needed. Breast cancer screening: she had abnl mammography 07/2014 and f/u diagnostic L breast mammography was ordered today to f/u L breast calcifications. Lung cancer screening: pt long time smoker and will not quit.  Ordered CT lung ca screening to be scheduled 09/2015--one year from the last one.  2) Hx of multinodular goiter: following with annual ultrasounds: ordered this today, to be scheduled in August 2017.  TSH drawn today---she has been euthyroid.  3) Chronic pain syndrome: I have been treating her chronic neck, low back, and left shoulder pain in good faith.  Pt has repeatedly stated she can't afford to go to a pain mgmt clinic. I printed rx's for oxycodone 15, 1 qid prn, #120 today for July, August, and September 2017.  Appropriate fill on/after date was noted on each rx.  4) Essential tremor: reassured pt.  She says this is treated well when she takes her diazepam.  An After Visit Summary was printed and given to the patient.  FOLLOW UP:  Return in about 3 months (around 11/01/2015) for routine chronic illness f/u (30 min).  Signed:  Santiago BumpersPhil Anaika Santillano, MD           08/01/2015  ADDENDUM: forgot to discuss colon cancer screening with pt today.  She has a lot going on right now as it is.  We'll discuss this at next f/u visit in 3 mo.--PM

## 2015-08-02 ENCOUNTER — Other Ambulatory Visit (INDEPENDENT_AMBULATORY_CARE_PROVIDER_SITE_OTHER): Payer: Medicare Other

## 2015-08-02 ENCOUNTER — Encounter: Payer: Self-pay | Admitting: Family Medicine

## 2015-08-02 DIAGNOSIS — R7301 Impaired fasting glucose: Secondary | ICD-10-CM | POA: Diagnosis not present

## 2015-08-02 LAB — HEMOGLOBIN A1C: HEMOGLOBIN A1C: 5.7 % (ref 4.6–6.5)

## 2015-08-06 ENCOUNTER — Other Ambulatory Visit: Payer: Self-pay | Admitting: Family Medicine

## 2015-08-06 MED ORDER — SIMVASTATIN 20 MG PO TABS: 20.0000 mg | ORAL_TABLET | Freq: Every day | ORAL | Status: DC

## 2015-08-16 ENCOUNTER — Other Ambulatory Visit: Payer: Self-pay | Admitting: Family Medicine

## 2015-08-16 DIAGNOSIS — R911 Solitary pulmonary nodule: Secondary | ICD-10-CM

## 2015-08-21 ENCOUNTER — Other Ambulatory Visit: Payer: Self-pay | Admitting: Family Medicine

## 2015-08-21 DIAGNOSIS — R911 Solitary pulmonary nodule: Secondary | ICD-10-CM

## 2015-08-22 ENCOUNTER — Ambulatory Visit
Admission: RE | Admit: 2015-08-22 | Discharge: 2015-08-22 | Disposition: A | Discharge status: Home / Self Care | Payer: Medicare Other | Source: Ambulatory Visit | Attending: Family Medicine | Admitting: Family Medicine

## 2015-08-22 ENCOUNTER — Other Ambulatory Visit: Payer: Self-pay | Admitting: Family Medicine

## 2015-08-22 DIAGNOSIS — R921 Mammographic calcification found on diagnostic imaging of breast: Secondary | ICD-10-CM

## 2015-09-11 ENCOUNTER — Telehealth: Payer: Self-pay | Admitting: Family Medicine

## 2015-09-11 NOTE — Telephone Encounter (Signed)
Pt has an appt tomorrow and they have an order in epic that needs Dr Verdis Frederickson signature please.

## 2015-09-11 NOTE — Telephone Encounter (Signed)
Order faxed. Felia advised and voiced understanding.

## 2015-09-16 ENCOUNTER — Other Ambulatory Visit: Payer: Medicare Other

## 2015-09-23 ENCOUNTER — Other Ambulatory Visit: Payer: Medicare Other

## 2015-09-23 ENCOUNTER — Ambulatory Visit: Payer: Medicare Other

## 2015-09-30 ENCOUNTER — Other Ambulatory Visit: Payer: Medicare Other

## 2015-09-30 ENCOUNTER — Ambulatory Visit: Payer: Medicare Other

## 2015-10-04 ENCOUNTER — Ambulatory Visit
Admission: RE | Admit: 2015-10-04 | Discharge: 2015-10-04 | Disposition: A | Discharge status: Home / Self Care | Payer: Medicare Other | Source: Ambulatory Visit | Attending: Family Medicine | Admitting: Family Medicine

## 2015-10-04 DIAGNOSIS — R921 Mammographic calcification found on diagnostic imaging of breast: Secondary | ICD-10-CM

## 2015-10-07 ENCOUNTER — Other Ambulatory Visit: Payer: Self-pay | Admitting: Family Medicine

## 2015-10-09 ENCOUNTER — Encounter: Payer: Self-pay | Admitting: Family Medicine

## 2015-10-11 ENCOUNTER — Ambulatory Visit
Admission: RE | Admit: 2015-10-11 | Discharge: 2015-10-11 | Disposition: A | Discharge status: Home / Self Care | Payer: Medicare Other | Source: Ambulatory Visit | Attending: Family Medicine | Admitting: Family Medicine

## 2015-10-11 DIAGNOSIS — E042 Nontoxic multinodular goiter: Secondary | ICD-10-CM | POA: Diagnosis not present

## 2015-10-11 DIAGNOSIS — R911 Solitary pulmonary nodule: Secondary | ICD-10-CM

## 2015-10-11 DIAGNOSIS — Z87891 Personal history of nicotine dependence: Secondary | ICD-10-CM | POA: Diagnosis not present

## 2015-10-15 ENCOUNTER — Encounter: Payer: Self-pay | Admitting: Family Medicine

## 2015-10-15 ENCOUNTER — Other Ambulatory Visit: Payer: Self-pay | Admitting: Family Medicine

## 2015-10-15 DIAGNOSIS — R911 Solitary pulmonary nodule: Secondary | ICD-10-CM

## 2015-10-30 ENCOUNTER — Other Ambulatory Visit: Payer: Self-pay | Admitting: Family Medicine

## 2015-11-01 ENCOUNTER — Encounter: Payer: Self-pay | Admitting: Family Medicine

## 2015-11-01 ENCOUNTER — Ambulatory Visit (INDEPENDENT_AMBULATORY_CARE_PROVIDER_SITE_OTHER): Payer: Medicare Other | Admitting: Family Medicine

## 2015-11-01 VITALS — BP 128/77 | HR 78 | Temp 97.7°F | Resp 20 | Wt 126.0 lb

## 2015-11-01 DIAGNOSIS — Z79891 Long term (current) use of opiate analgesic: Secondary | ICD-10-CM | POA: Diagnosis not present

## 2015-11-01 DIAGNOSIS — G894 Chronic pain syndrome: Secondary | ICD-10-CM

## 2015-11-01 DIAGNOSIS — Z23 Encounter for immunization: Secondary | ICD-10-CM

## 2015-11-01 DIAGNOSIS — J438 Other emphysema: Secondary | ICD-10-CM | POA: Diagnosis not present

## 2015-11-01 DIAGNOSIS — R911 Solitary pulmonary nodule: Secondary | ICD-10-CM

## 2015-11-01 DIAGNOSIS — F17200 Nicotine dependence, unspecified, uncomplicated: Secondary | ICD-10-CM

## 2015-11-01 DIAGNOSIS — I10 Essential (primary) hypertension: Secondary | ICD-10-CM

## 2015-11-01 DIAGNOSIS — Z79899 Other long term (current) drug therapy: Secondary | ICD-10-CM | POA: Diagnosis not present

## 2015-11-01 IMAGING — CT CT CHEST LUNG CANCER SCREENING LOW DOSE W/O CM
2 of 5 series · 15 of 40 positions shown, 18 images · non-contrast
Comparison: Plain films, most recent 09/04/2014.  No prior CT.

CLINICAL DATA: Smoker with 84 pack-year smoking history.
Asymptomatic.

EXAM:
CT CHEST WITHOUT CONTRAST
TECHNIQUE: Multidetector CT imaging of the chest was performed following the
standard protocol without IV contrast.

[Series 3: ldct lung · axial · 0.62mm/px · z∈[+1146,+1408]mm · 12 of 290 slices shown, 15 images]
[im 14/290  mediastinal]
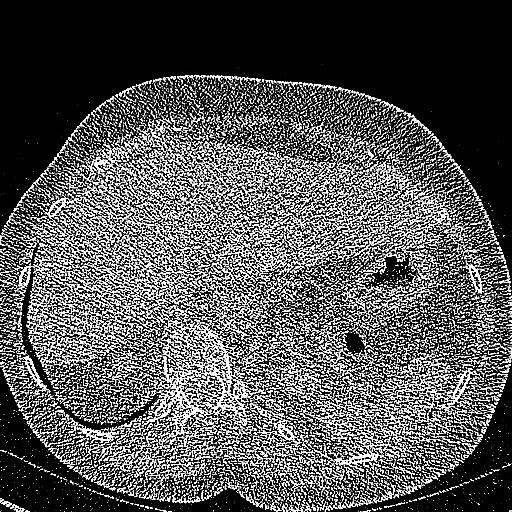
[im 14/290  lung]
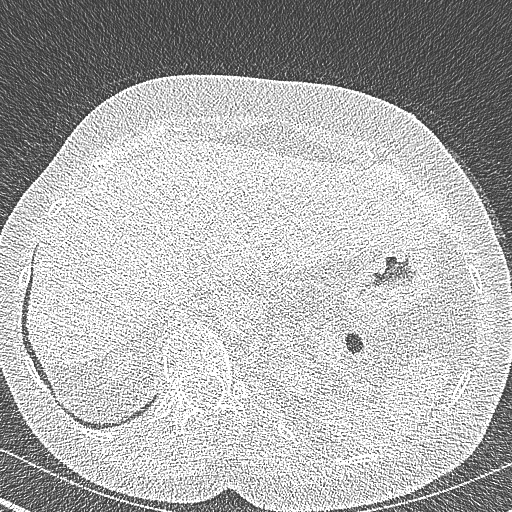
[im 40/290  lung]
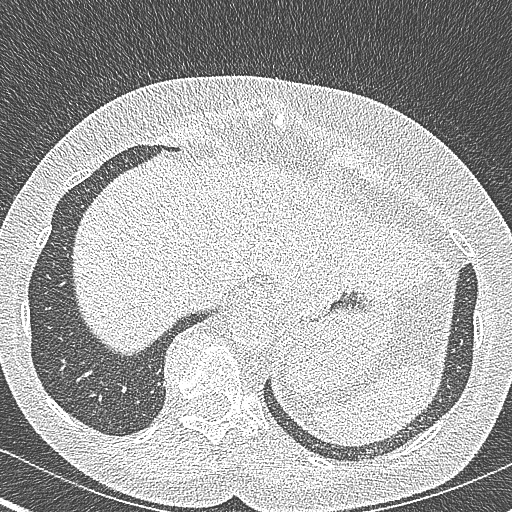
[im 66/290  lung]
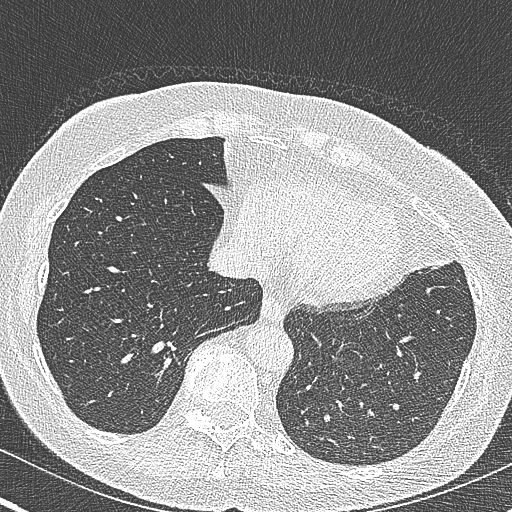
[im 92/290  lung]
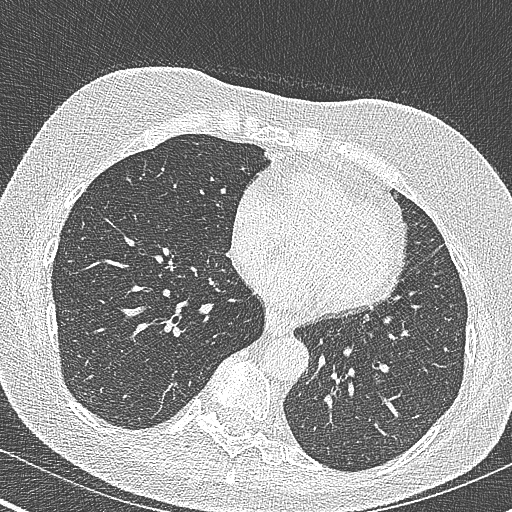
[im 106/290  mediastinal]
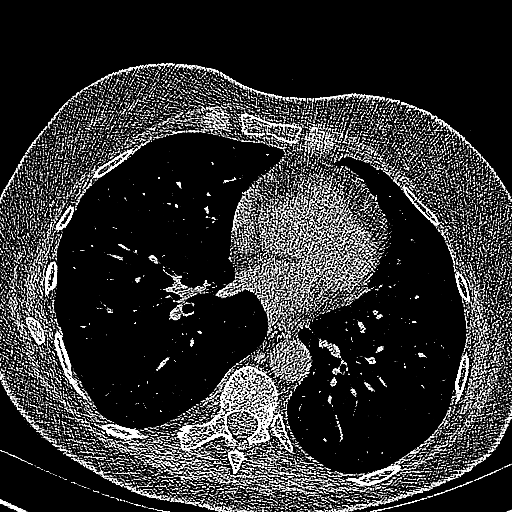
[im 106/290  lung]
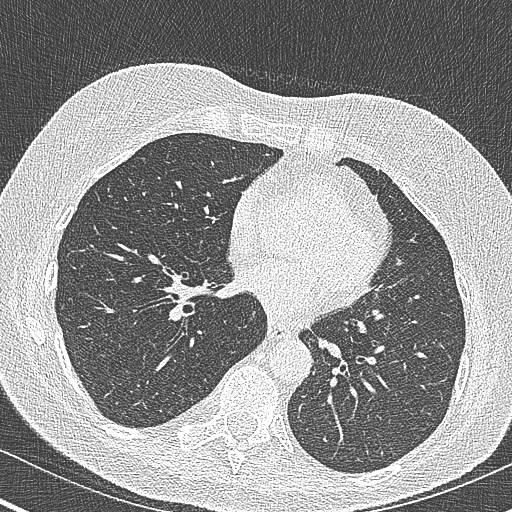
[im 132/290  lung]
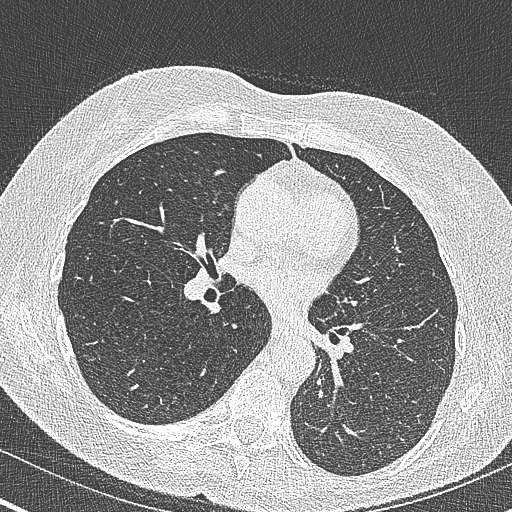
[im 158/290  lung]
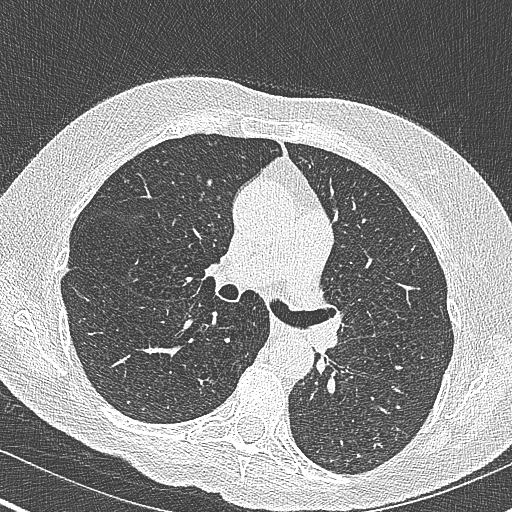
[im 184/290  lung]
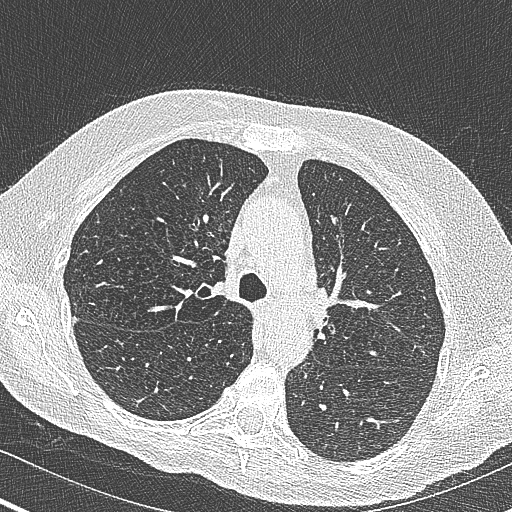
[im 198/290  mediastinal]
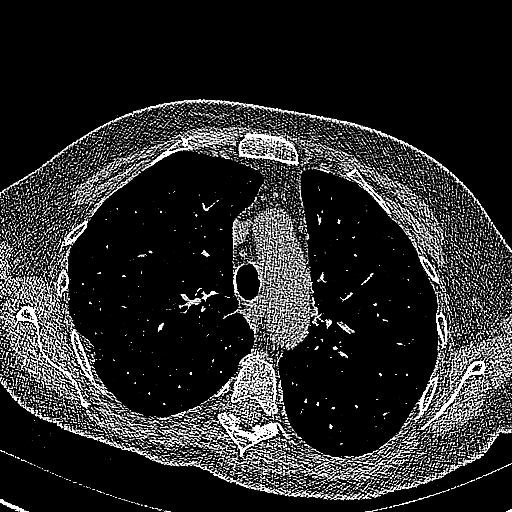
[im 198/290  lung]
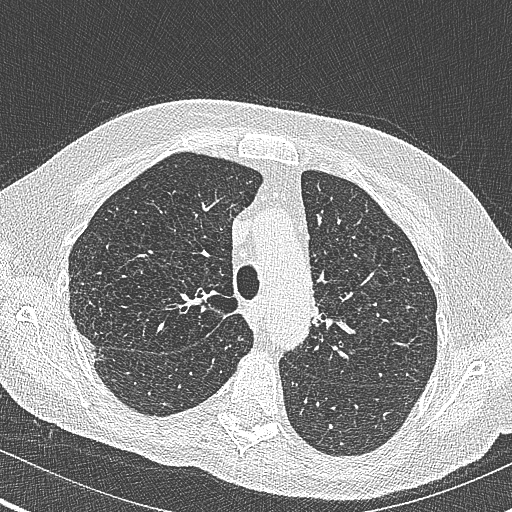
[im 224/290  lung]
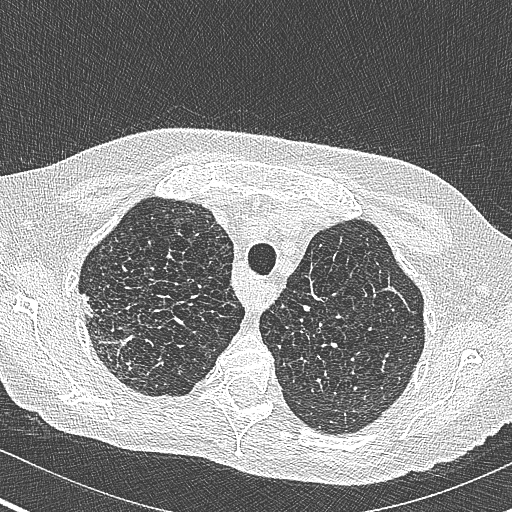
[im 250/290  lung]
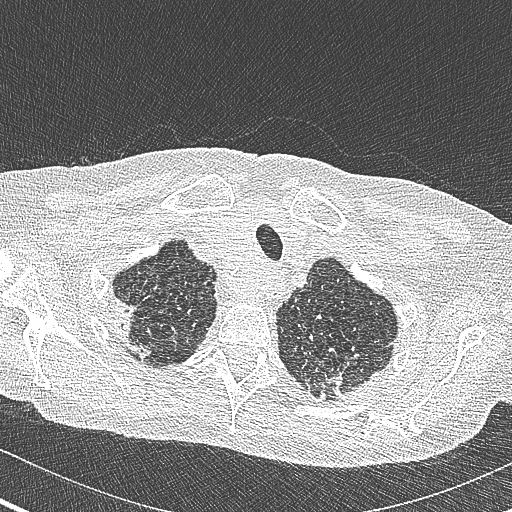
[im 276/290  lung]
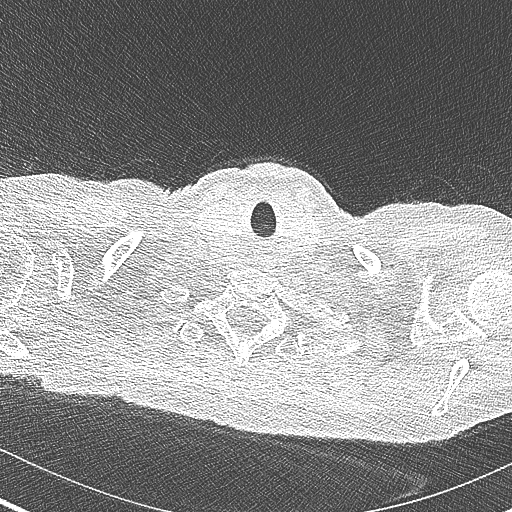

[Series 5: chest 1.0 coronal · coronal · 0.59mm/px · 3 of 275 slices shown]
[im 55/275  lung]
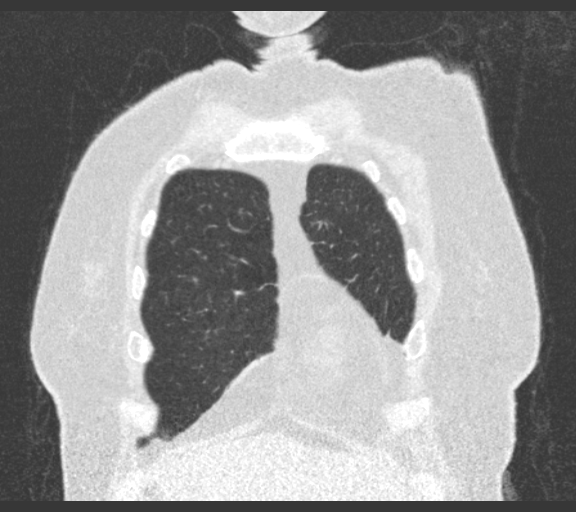
[im 110/275  lung]
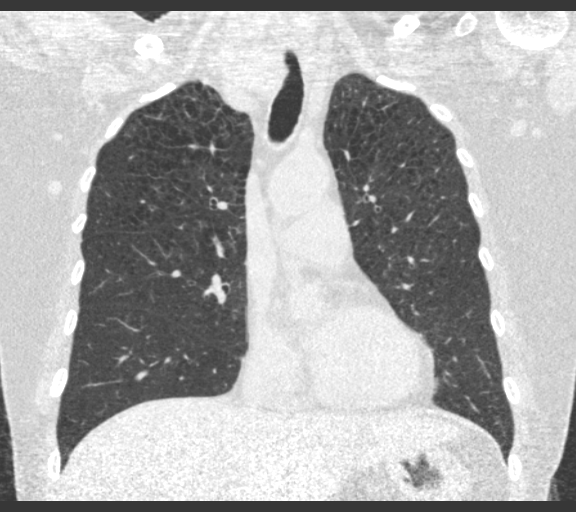
[im 165/275  lung]
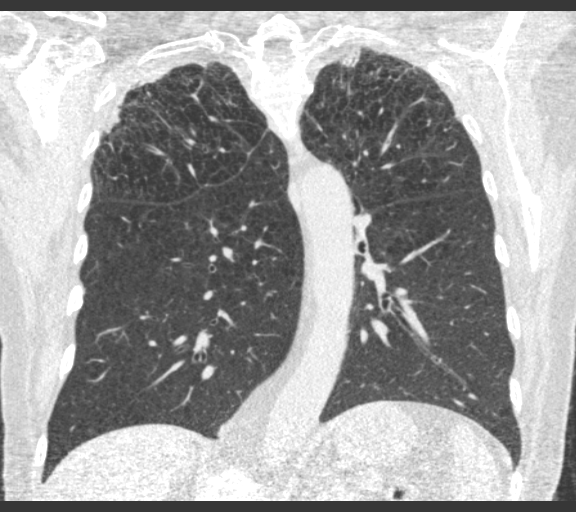

[15 of 40 positions shown; findings below may reference images not displayed]

FINDINGS: Mediastinum/Nodes: Diffuse enlargement of the thyroid, mild.
Extension of the posterior aspect of the right lobe into the upper
chest, with an exophytic 3.2 x 2.9 cm lesion identified on image 10
of series 2. Minimal calcification within. Mildly tortuous
descending thoracic aorta. Normal heart size, without pericardial
effusion. No mediastinal or definite hilar adenopathy, given
limitations of unenhanced CT.

Lungs/Pleura: No pleural fluid. Biapical pleural parenchymal
scarring. Moderate centrilobular emphysema. Bronchial wall
thickening. Pulmonary nodules which measure maximally 5 mm
(subpleural right lower lobe on image 167 of series 3).

Upper abdomen: Normal imaged portions of the liver, spleen, stomach,
kidneys.

Musculoskeletal: Convex left thoracic spine curvature.
IMPRESSION: 1. Centrilobular emphysema with pulmonary nodules measuring 5 mm
maximally. Lung-RADS Category 2, benign appearance or behavior.
Continue annual screening with low-dose chest CT without contrast in
12 months.
2. Enlarging of the right lobe of the thyroid, with a nodule
extending into the upper chest. Recommend further evaluation with
ultrasound.

## 2015-11-01 MED ORDER — OXYCODONE HCL 15 MG PO TABS: ORAL_TABLET | ORAL | 0 refills | Status: DC

## 2015-11-01 NOTE — Progress Notes (Signed)
Pre visit review using our clinic review tool, if applicable. No additional management support is needed unless otherwise documented below in the visit note. 

## 2015-11-01 NOTE — Progress Notes (Signed)
OFFICE VISIT  11/01/2015   CC:  Chief Complaint  Patient presents with  . Follow-up     HPI:    Patient is a 70 y.o. Caucasian female who presents for 3 mo f/u COPD, HTN, hyperlipidemia, ongoing tob abuse.  She has chronic neck and low back pain for which narcotic pain med is required for functioning.   Takes all meds as rx'd.   No side effects. BP monitoring at home normal.  Most recent oxycodone was 8AM today. Most recent diazepam was 11 PM. She had lots of questions today about her recent CT chest that showed a suspicious pulm nodule. We have plans to repeat this as per radiologist's recommendations.    Past Medical History:  Diagnosis Date  . Abnormal mammogram 09/2014   left (calcifications): repeat diagnostic L mammogram 6 mo ok.  Repeat mammo 10/09/15 also stable.  . Breast nodule 2008   Multiple mammos and u/s's done to follow this nonpalpable nodule on right breast: pt reports it eventually resolved.  Screening mammo neg/nl 01/2009.  Marland Kitchen Chronic left shoulder pain    Impingement syndrome: Ortho at [facility] did MRI and recommended surgery and this was done 2016.  Marland Kitchen Chronic low back pain   . Chronic neck pain   . COPD (chronic obstructive pulmonary disease) (HCC)   . DDD (degenerative disc disease), lumbar    MRI 05/2014.  Pt declined specialist referral AND said she could not afford PT.  . Family history of abdominal aortic aneurysm    sister.  Screening u/s 09/2014 NEG.  Marland Kitchen Herpes zoster 2011  . History of hypokalemia    on HCTZ.  KDUR supplement started 09/20/2014  . Hyperlipidemia   . Hypertension   . IFG (impaired fasting glucose) 07/2015   HbA1c 5.7%  . Multinodular goiter 2006; 2016   Biopsy negative per pt report.  U/s showed multinodular goiter-euthyroid.  Thyroid uptake scan nl except dominant R sided cold nodule (bx neg).  09/2014: RAI uptake and scan NEG, thyroid u/s with minimal change.  10/11/15 repeat u/s again showed minimal change--NO F/U imaging is required  anymore.  . Osteoporosis 09/2014  . Pulmonary nodule 2016 &2017   3 mo f/u LTD CT chest nodule f/u w/out contrast ordered/planned for 01/2016.  . Tobacco abuse    80+ pack-yr hx.  CT screening for lung ca 09/2014 showed multiple nodules <41mm; f/u CT in 1 yr recommended.  . Wrist fracture, closed 2012   right    Past Surgical History:  Procedure Laterality Date  . ABDOMINAL HYSTERECTOMY  approx 1997   Fibroids (ovaries ARE still in)  . HEMORRHOID SURGERY  approx 1982  . RAI THYROID UPTAKE&SCAN  09/12/14   NO dominant hot or cold nodule--diffuse uptake and gland enlargement c/w multinodular goiter.  Marland Kitchen SHOULDER SURGERY Left 10/2014   RC and bone spurs: pt refused post-op rehab and continued to smoke    Outpatient Medications Prior to Visit  Medication Sig Dispense Refill  . alendronate (FOSAMAX) 70 MG tablet TAKE 1 TAB BY MOUTH EVERY 7 DAYS. TAKE WITH A FULL GLASS OF WATER ON AN EMPTY STOMACH AND REMAIN SITTING UPRIGHT FOR 30 MINUTES AFTER TAKING 4 tablet 0  . aspirin EC 81 MG tablet Take 81 mg by mouth daily.    Marland Kitchen CALCIUM CARBONATE PO Take by mouth.    . diazepam (VALIUM) 10 MG tablet TAKE ONE TABLET BY MOUTH EVERY 12 HOURS AS NEEDED FOR ANXIETY 60 tablet 5  . hydrochlorothiazide (HYDRODIURIL) 25  MG tablet TAKE ONE TABLET BY MOUTH DAILY 30 tablet 6  . Multiple Vitamin (MULTIVITAMIN) tablet Take 1 tablet by mouth daily.      . Omega-3 Fatty Acids (FISH OIL) 1200 MG CAPS Take by mouth daily.      . potassium chloride SA (K-DUR,KLOR-CON) 20 MEQ tablet 2 tabs po qd 60 tablet 12  . promethazine (PHENERGAN) 12.5 MG tablet Take 1 tablet (12.5 mg total) by mouth every 6 (six) hours as needed for nausea or vomiting. 30 tablet 6  . ranitidine (ZANTAC) 150 MG tablet Take 1 tablet (150 mg total) by mouth 2 (two) times daily. 60 tablet 12  . simvastatin (ZOCOR) 20 MG tablet Take 1 tablet (20 mg total) by mouth at bedtime. 30 tablet 6  . oxyCODONE (ROXICODONE) 15 MG immediate release tablet 1 tab po qid  prn 120 tablet 0   No facility-administered medications prior to visit.   ProAir HFA: 1-2 p q6h prn  Allergies  Allergen Reactions  . Depo-Medrol [Methylprednisolone] Other (See Comments)    Migraine    ROS As per HPI  PE: Blood pressure 128/77, pulse 78, temperature 97.7 F (36.5 C), temperature source Oral, resp. rate 20, weight 126 lb (57.2 kg), SpO2 94 %. Gen: Alert, well appearing.  Patient is oriented to person, place, time, and situation. AFFECT: pleasant, lucid thought and speech. CV: RRR, no m/r/g.   LUNGS: CTA bilat, nonlabored resps, good aeration in all lung fields.   LABS:  Lab Results  Component Value Date   TSH 0.51 08/01/2015   Lab Results  Component Value Date   WBC 10.1 08/01/2015   HGB 17.3 (H) 08/01/2015   HCT 50.8 (H) 08/01/2015   MCV 88.7 08/01/2015   PLT 327.0 08/01/2015   Lab Results  Component Value Date   CREATININE 0.86 08/01/2015   BUN 8 08/01/2015   NA 140 08/01/2015   K 3.8 08/01/2015   CL 100 08/01/2015   CO2 30 08/01/2015   Lab Results  Component Value Date   ALT 11 08/01/2015   AST 17 08/01/2015   ALKPHOS 85 08/01/2015   BILITOT 0.5 08/01/2015   Lab Results  Component Value Date   CHOL 231 (H) 08/01/2015   Lab Results  Component Value Date   HDL 47.20 08/01/2015   Lab Results  Component Value Date   LDLCALC 151 (H) 08/01/2015   Lab Results  Component Value Date   TRIG 168.0 (H) 08/01/2015   Lab Results  Component Value Date   CHOLHDL 5 08/01/2015   Lab Results  Component Value Date   HGBA1C 5.7 08/02/2015   IMPRESSION AND PLAN:  1) Chronic pain syndrome/C and L spine arthritis: urine tox screen today. I printed rx's for oxycodone 15mg , 1 tab qid prn, #120 today for this month, Oct 2017, and Nov 2017.  Appropriate fill on/after date was noted on each rx.  2) COPD: The current medical regimen is effective;  continue present plan and medications. Has ProAir MDI to use q6h prn.    3) Tobacco dependence:  she is trying to vap as substitute but is still smoking cigs too. Encouraged complete cessation.  4) Hyperlipidemia: tolerating simvastatin that she started 07/2015. Not fasting today.  5) HTN; The current medical regimen is effective;  continue present plan and medications. Lytes/cr good 07/2015.  6) Pulm nodule: has f/u CT ordered already as per radiologist's recommendations.  An After Visit Summary was printed and given to the patient.  FOLLOW UP: Return  in about 3 months (around 01/31/2016) for routine chronic illness f/u.  Signed:  Santiago BumpersPhil Saifullah Jolley, MD           11/01/2015

## 2015-11-27 ENCOUNTER — Telehealth: Payer: Self-pay

## 2015-11-27 NOTE — Telephone Encounter (Signed)
Pharmacy called asking if patient can fill Oxycodone 1 day early since fill date is on a Saturday.  Dr. Milinda CaveMcGowen consulted and approved early refill.  Pharmacy notified and will inform patient.

## 2015-12-17 ENCOUNTER — Ambulatory Visit: Payer: Medicare Other | Admitting: Family Medicine

## 2015-12-19 ENCOUNTER — Encounter: Payer: Self-pay | Admitting: Family Medicine

## 2015-12-19 ENCOUNTER — Ambulatory Visit (INDEPENDENT_AMBULATORY_CARE_PROVIDER_SITE_OTHER): Payer: Medicare Other | Admitting: Family Medicine

## 2015-12-19 VITALS — BP 117/80 | HR 104 | Temp 98.6°F | Resp 18 | Wt 122.8 lb

## 2015-12-19 DIAGNOSIS — F331 Major depressive disorder, recurrent, moderate: Secondary | ICD-10-CM

## 2015-12-19 DIAGNOSIS — R5382 Chronic fatigue, unspecified: Secondary | ICD-10-CM | POA: Diagnosis not present

## 2015-12-19 DIAGNOSIS — R590 Localized enlarged lymph nodes: Secondary | ICD-10-CM

## 2015-12-19 LAB — CBC WITH DIFFERENTIAL/PLATELET
Basophils Absolute: 0.1 10*3/uL (ref 0.0–0.1)
Basophils Relative: 0.8 % (ref 0.0–3.0)
Eosinophils Absolute: 0.2 10*3/uL (ref 0.0–0.7)
Eosinophils Relative: 1.4 % (ref 0.0–5.0)
HEMATOCRIT: 50.2 % — AB (ref 36.0–46.0)
Hemoglobin: 17.1 g/dL — ABNORMAL HIGH (ref 12.0–15.0)
LYMPHS ABS: 3.6 10*3/uL (ref 0.7–4.0)
Lymphocytes Relative: 30.3 % (ref 12.0–46.0)
MCHC: 34.2 g/dL (ref 30.0–36.0)
MCV: 89.8 fl (ref 78.0–100.0)
MONO ABS: 0.7 10*3/uL (ref 0.1–1.0)
MONOS PCT: 6.1 % (ref 3.0–12.0)
Neutro Abs: 7.4 10*3/uL (ref 1.4–7.7)
Neutrophils Relative %: 61.4 % (ref 43.0–77.0)
PLATELETS: 286 10*3/uL (ref 150.0–400.0)
RBC: 5.6 Mil/uL — ABNORMAL HIGH (ref 3.87–5.11)
RDW: 13.4 % (ref 11.5–15.5)
WBC: 12 10*3/uL — AB (ref 4.0–10.5)

## 2015-12-19 LAB — BASIC METABOLIC PANEL
BUN: 16 mg/dL (ref 6–23)
CALCIUM: 10.6 mg/dL — AB (ref 8.4–10.5)
CO2: 31 mEq/L (ref 19–32)
CREATININE: 0.9 mg/dL (ref 0.40–1.20)
Chloride: 100 mEq/L (ref 96–112)
GFR: 66.15 mL/min (ref >60.00)
Glucose, Bld: 74 mg/dL (ref 70–99)
POTASSIUM: 3.9 meq/L (ref 3.5–5.1)
Sodium: 140 mEq/L (ref 135–145)

## 2015-12-19 LAB — TSH: TSH: 0.56 u[IU]/mL (ref 0.35–4.50)

## 2015-12-19 MED ORDER — DULOXETINE HCL 30 MG PO CPEP: 30.0000 mg | ORAL_CAPSULE | Freq: Every day | ORAL | 1 refills | Status: DC

## 2015-12-19 NOTE — Progress Notes (Signed)
OFFICE VISIT  12/19/2015   CC:  Chief Complaint  Patient presents with  . Fatigue    No energy   HPI:    Patient is a 70 y.o. Caucasian female who presents for "feeling down" and has no energy. Had a week recently in which she says she slept only 10 hours.  Says she doesn't feel safe where she lives b/c of the neighborhood.  She feels depressed, largely due to her husband's situation of living in an ALF and he always asks her to take him home and she can't.  She has chronic neck, shoulders, and back pain.  Lump came up on R side of neck about a week ago.   Also says her sister has cancer and it has spread to her brain and she is very upset about this.  Past Medical History:  Diagnosis Date  . Abnormal mammogram 09/2014   left (calcifications): repeat diagnostic L mammogram 6 mo ok.  Repeat mammo 10/09/15 also stable.  . Breast nodule 2008   Multiple mammos and u/s's done to follow this nonpalpable nodule on right breast: pt reports it eventually resolved.  Screening mammo neg/nl 01/2009.  Marland Kitchen. Chronic left shoulder pain    Impingement syndrome: [facility] and recommended surgery and this was done 2016.  Marland Kitchen. Chronic low back pain   . Chronic neck pain   . COPD (chronic obstructive pulmonary disease) (HCC)   . DDD (degenerative disc disease), lumbar    MRI 05/2014.  Pt declined specialist referral AND said she could not afford PT.  . Family history of abdominal aortic aneurysm    sister.  Screening u/s 09/2014 NEG.  Marland Kitchen. Herpes zoster 2011  . History of hypokalemia    on HCTZ.  KDUR supplement started 09/20/2014  . Hyperlipidemia 07/2015   simvastatin started 07/2015  . Hypertension   . IFG (impaired fasting glucose) 07/2015   HbA1c 5.7%  . Multinodular goiter 2006; 2016   Biopsy negative per pt report.  U/s showed multinodular goiter-euthyroid.  Thyroid uptake scan nl except dominant R sided cold nodule (bx neg).  09/2014: RAI uptake and scan NEG, thyroid u/s with minimal change.   10/11/15 repeat u/s again showed minimal change--NO F/U imaging is required anymore.  . Osteoporosis 09/2014  . Pulmonary nodule 2016 &2017   3 mo f/u LTD CT chest nodule f/u w/out contrast ordered/planned for 01/2016.  . Tobacco abuse    80+ pack-yr hx.  CT screening for lung ca 09/2014 showed multiple nodules <715mm; f/u CT in 1 yr recommended.  . Wrist fracture, closed 2012   right    Past Surgical History:  Procedure Laterality Date  . ABDOMINAL HYSTERECTOMY  approx 1997   Fibroids (ovaries ARE still in)  . HEMORRHOID SURGERY  approx 1982  . RAI THYROID UPTAKE&SCAN  09/12/14   NO dominant hot or cold nodule--diffuse uptake and gland enlargement c/w multinodular goiter.  Marland Kitchen. SHOULDER SURGERY Left 10/2014   RC and bone spurs: pt refused post-op rehab and continued to smoke    Outpatient Medications Prior to Visit  Medication Sig Dispense Refill  . alendronate (FOSAMAX) 70 MG tablet TAKE 1 TAB BY MOUTH EVERY 7 DAYS. TAKE WITH A FULL GLASS OF WATER ON AN EMPTY STOMACH AND REMAIN SITTING UPRIGHT FOR 30 MINUTES AFTER TAKING 4 tablet 0  . aspirin EC 81 MG tablet Take 81 mg by mouth daily.    Marland Kitchen. CALCIUM CARBONATE PO Take by mouth.    .Marland Kitchen  diazepam (VALIUM) 10 MG tablet TAKE ONE TABLET BY MOUTH EVERY 12 HOURS AS NEEDED FOR ANXIETY 60 tablet 5  . hydrochlorothiazide (HYDRODIURIL) 25 MG tablet TAKE ONE TABLET BY MOUTH DAILY 30 tablet 6  . Multiple Vitamin (MULTIVITAMIN) tablet Take 1 tablet by mouth daily.      . Omega-3 Fatty Acids (FISH OIL) 1200 MG CAPS Take by mouth daily.      Marland Kitchen. oxyCODONE (ROXICODONE) 15 MG immediate release tablet 1 tab po qid prn 120 tablet 0  . potassium chloride SA (K-DUR,KLOR-CON) 20 MEQ tablet 2 tabs po qd 60 tablet 12  . PROAIR HFA 108 (90 Base) MCG/ACT inhaler     . promethazine (PHENERGAN) 12.5 MG tablet Take 1 tablet (12.5 mg total) by mouth every 6 (six) hours as needed for nausea or vomiting. 30 tablet 6  . ranitidine (ZANTAC) 150 MG tablet Take 1 tablet (150 mg total)  by mouth 2 (two) times daily. 60 tablet 12  . simvastatin (ZOCOR) 20 MG tablet Take 1 tablet (20 mg total) by mouth at bedtime. 30 tablet 6   No facility-administered medications prior to visit.     Allergies  Allergen Reactions  . Depo-Medrol [Methylprednisolone] Other (See Comments)    Migraine    ROS As per HPI  PE: Blood pressure 117/80, pulse (!) 104, temperature 98.6 F (37 C), temperature source Temporal, resp. rate 18, weight 122 lb 12.8 oz (55.7 kg), SpO2 94 %. Gen: Alert, tired-appearing.  Patient is oriented to person, place, time, and situation. Affect: anxious and sad.  Lucid thought and speech. ENT: oropharynx clear/without lesion.   NECK: R anter cerv region has a 2-3 cm clump of firm nodes, with a lone smaller, firm node palpable inferior to this.  L neck also has one small firm node about mid-anterior cervical level.  The nodes don't feel fixed to surrounding tissue.  They are nontender.  LABS:  Lab Results  Component Value Date   TSH 0.51 08/01/2015   Lab Results  Component Value Date   WBC 10.1 08/01/2015   HGB 17.3 (H) 08/01/2015   HCT 50.8 (H) 08/01/2015   MCV 88.7 08/01/2015   PLT 327.0 08/01/2015   Lab Results  Component Value Date   CREATININE 0.86 08/01/2015   BUN 8 08/01/2015   NA 140 08/01/2015   K 3.8 08/01/2015   CL 100 08/01/2015   CO2 30 08/01/2015   Lab Results  Component Value Date   ALT 11 08/01/2015   AST 17 08/01/2015   ALKPHOS 85 08/01/2015   BILITOT 0.5 08/01/2015   Lab Results  Component Value Date   CHOL 231 (H) 08/01/2015   Lab Results  Component Value Date   HDL 47.20 08/01/2015   Lab Results  Component Value Date   LDLCALC 151 (H) 08/01/2015   Lab Results  Component Value Date   TRIG 168.0 (H) 08/01/2015   Lab Results  Component Value Date   CHOLHDL 5 08/01/2015   Lab Results  Component Value Date   HGBA1C 5.7 08/02/2015    IMPRESSION AND PLAN:  1) Right anterior cervical LAD. Need to get CT  imaging to further assess. Check CBC and BMET today.  2) Fatigue: suspect multifactorial, including depression, chronic pain and chronic pain med use, chronic benzo use, ongoing tobacco use, and possibly simvastatin side effect. Plan is to stop simvastatin. Start duloxetine 30mg  qd.   Encouraged smoking cessation. Will check CBC, BMET, and TSH today.  3) Depression: start duloxetine 30mg   qd.  An After Visit Summary was printed and given to the patient.  FOLLOW UP: Return in about 4 weeks (around 01/16/2016) for f/u fatigue/dep.  Signed:  Santiago Bumpers, MD           12/19/2015

## 2015-12-21 ENCOUNTER — Encounter (HOSPITAL_BASED_OUTPATIENT_CLINIC_OR_DEPARTMENT_OTHER): Payer: Self-pay

## 2015-12-21 ENCOUNTER — Ambulatory Visit (HOSPITAL_BASED_OUTPATIENT_CLINIC_OR_DEPARTMENT_OTHER)
Admission: RE | Admit: 2015-12-21 | Discharge: 2015-12-21 | Disposition: A | Discharge status: Home / Self Care | Payer: Medicare Other | Source: Ambulatory Visit | Attending: Family Medicine | Admitting: Family Medicine

## 2015-12-21 DIAGNOSIS — J439 Emphysema, unspecified: Secondary | ICD-10-CM | POA: Diagnosis not present

## 2015-12-21 DIAGNOSIS — R5382 Chronic fatigue, unspecified: Secondary | ICD-10-CM | POA: Insufficient documentation

## 2015-12-21 DIAGNOSIS — R590 Localized enlarged lymph nodes: Secondary | ICD-10-CM | POA: Insufficient documentation

## 2015-12-21 DIAGNOSIS — H7011 Chronic mastoiditis, right ear: Secondary | ICD-10-CM | POA: Insufficient documentation

## 2015-12-21 DIAGNOSIS — E049 Nontoxic goiter, unspecified: Secondary | ICD-10-CM | POA: Insufficient documentation

## 2015-12-21 MED ORDER — IOPAMIDOL (ISOVUE-300) INJECTION 61%
75.0000 mL | Freq: Once | INTRAVENOUS | Status: AC | PRN
  Administered 2015-12-21: 100 mL via INTRAVENOUS

## 2015-12-23 ENCOUNTER — Encounter: Payer: Self-pay | Admitting: Family Medicine

## 2015-12-24 ENCOUNTER — Telehealth: Payer: Self-pay

## 2015-12-24 ENCOUNTER — Other Ambulatory Visit: Payer: Self-pay | Admitting: Family Medicine

## 2015-12-24 DIAGNOSIS — D72829 Elevated white blood cell count, unspecified: Secondary | ICD-10-CM

## 2015-12-24 DIAGNOSIS — R5382 Chronic fatigue, unspecified: Secondary | ICD-10-CM

## 2015-12-24 DIAGNOSIS — D751 Secondary polycythemia: Secondary | ICD-10-CM

## 2015-12-24 NOTE — Telephone Encounter (Signed)
Patient called stating that she is feeling worse on Cymbalta and wants to know why she cant swallow without her throat hurting.  Patient has follow up in December.

## 2015-12-24 NOTE — Telephone Encounter (Signed)
Will call and discuss this with pt.  Will also discuss recent CT neck results with her.

## 2015-12-25 ENCOUNTER — Other Ambulatory Visit: Payer: Self-pay | Admitting: Family Medicine

## 2015-12-26 NOTE — Telephone Encounter (Signed)
Please advise 

## 2015-12-26 NOTE — Telephone Encounter (Signed)
Patient calling back to report she has additional questions for pcp.  She needs to know:  1. What type of testing can be done to determine if her thyroid is functioning properly.  2. How would she go about getting a consult with a throat specialist and how long does it typically take to get in.

## 2015-12-26 NOTE — Telephone Encounter (Signed)
I just did the blood test to check if the thyroid is functioning normally and it was normal. Regarding the specialist, she would actually need to see a general surgeon to see about getting thyroid surgery.  We would take care of getting her referred if that is what she wanted.-thx

## 2015-12-30 NOTE — Telephone Encounter (Signed)
Noted  

## 2015-12-30 NOTE — Telephone Encounter (Signed)
Patient aware.  She was very persistent about still getting thyroid medication for her hair falling out and her fatigue.  I advised patient that there is no need for thyroid medication since thyroid is functioning properly.   Patient declined referral for general surgery.

## 2016-01-08 ENCOUNTER — Other Ambulatory Visit: Payer: Self-pay | Admitting: *Deleted

## 2016-01-08 MED ORDER — DIAZEPAM 10 MG PO TABS: ORAL_TABLET | ORAL | 5 refills | Status: DC

## 2016-01-08 NOTE — Telephone Encounter (Signed)
RF request for diazepam LOV: 12/19/15 Next ov: 01/16/16 Last written: 07/31/15 #60 w/ 5RF  Please advise. Thanks.

## 2016-01-08 NOTE — Telephone Encounter (Signed)
Rx faxed

## 2016-01-14 ENCOUNTER — Ambulatory Visit
Admission: RE | Admit: 2016-01-14 | Discharge: 2016-01-14 | Disposition: A | Discharge status: Home / Self Care | Payer: Medicare Other | Source: Ambulatory Visit | Attending: Family Medicine | Admitting: Family Medicine

## 2016-01-14 DIAGNOSIS — Z87891 Personal history of nicotine dependence: Secondary | ICD-10-CM | POA: Diagnosis not present

## 2016-01-14 DIAGNOSIS — R911 Solitary pulmonary nodule: Secondary | ICD-10-CM

## 2016-01-15 ENCOUNTER — Other Ambulatory Visit: Payer: Self-pay | Admitting: Family Medicine

## 2016-01-15 ENCOUNTER — Encounter: Payer: Self-pay | Admitting: Family Medicine

## 2016-01-15 DIAGNOSIS — R911 Solitary pulmonary nodule: Secondary | ICD-10-CM

## 2016-01-16 ENCOUNTER — Ambulatory Visit (INDEPENDENT_AMBULATORY_CARE_PROVIDER_SITE_OTHER): Payer: Medicare Other | Admitting: Family Medicine

## 2016-01-16 ENCOUNTER — Encounter: Payer: Self-pay | Admitting: Family Medicine

## 2016-01-16 VITALS — BP 106/73 | HR 81 | Temp 97.4°F | Resp 16 | Ht 64.75 in | Wt 127.8 lb

## 2016-01-16 DIAGNOSIS — F320 Major depressive disorder, single episode, mild: Secondary | ICD-10-CM | POA: Diagnosis not present

## 2016-01-16 DIAGNOSIS — G894 Chronic pain syndrome: Secondary | ICD-10-CM | POA: Diagnosis not present

## 2016-01-16 DIAGNOSIS — F5101 Primary insomnia: Secondary | ICD-10-CM | POA: Diagnosis not present

## 2016-01-16 MED ORDER — OXYCODONE HCL 15 MG PO TABS: ORAL_TABLET | ORAL | 0 refills | Status: DC

## 2016-01-16 MED ORDER — PROMETHAZINE HCL 12.5 MG PO TABS: 12.5000 mg | ORAL_TABLET | Freq: Four times a day (QID) | ORAL | 6 refills | Status: AC | PRN

## 2016-01-16 MED ORDER — QUETIAPINE FUMARATE 50 MG PO TABS
50.0000 mg | ORAL_TABLET | Freq: Every day | ORAL | 3 refills | Status: DC
Start: 2016-01-16 — End: 2016-04-30

## 2016-01-16 NOTE — Progress Notes (Signed)
OFFICE VISIT  01/16/2016   CC:  Chief Complaint  Patient presents with  . Follow-up    Depression and Fatigue   HPI:    Patient is a 70 y.o. Caucasian female who presents for 1 mo f/u depression, insomnia, and chronic pain. Feels some increased energy since she stopped her simvastatin. She took duloxetine 30mg  only for a week and then stopped it "b/c it wasn't working".  We went over the fact that it takes longer than a week for antidepressants to help.  She continues to complain of problems initiating sleep at night.  Says she takes 2 of her diazepam hs to sleep and "this is the only thing that helps".  Feels unsafe in her home, wakes up often--feeling scared. Trazodone and ambien did not help in the past.  Says she does not sleep in daytime.  She needs RF's of her narcotic pain med.  She has chronic neck and low back pain for which narcotic pain med is required for functioning.  Takes 1 15mg  oxycodone tab qid.  Denies adverse side effects.     Past Medical History:  Diagnosis Date  . Abnormal mammogram 09/2014   left (calcifications): repeat diagnostic L mammogram 6 mo ok.  Repeat mammo 10/09/15 also stable.  . Breast nodule 2008   Multiple mammos and u/s's done to follow this nonpalpable nodule on right breast: pt reports it eventually resolved.  Screening mammo neg/nl 01/2009.  Marland Kitchen. Chronic left shoulder pain    Impingement syndrome: [facility] and recommended surgery and this was done 2016.  Marland Kitchen. Chronic low back pain   . Chronic neck pain   . COPD (chronic obstructive pulmonary disease) (HCC)   . DDD (degenerative disc disease), lumbar    MRI 05/2014.  Pt declined specialist referral AND said she could not afford PT.  . Family history of abdominal aortic aneurysm    sister.  Screening u/s 09/2014 NEG.  Marland Kitchen. Herpes zoster 2011  . History of hypokalemia    on HCTZ.  KDUR supplement started 09/20/2014  . Hyperlipidemia 07/2015   simvastatin started 07/2015  . Hypertension    . IFG (impaired fasting glucose) 07/2015   HbA1c 5.7%  . Multinodular goiter 2006; 2016   Biopsy negative per pt report.  U/s showed multinodular goiter-euthyroid.  Thyroid uptake scan nl except dominant R sided cold nodule (bx neg).  09/2014: RAI uptake and scan NEG, thyroid u/s with minimal change.  10/11/15 repeat u/s again showed minimal change--NO F/U imaging is required anymore.  . Osteoporosis 09/2014  . Pulmonary nodule 2016 &2017   3 mo f/u LTD CT chest nodule f/u w/out contrast done 01/14/16 and showed nodule RESOLVING.  Plan repeat screening noncontrast CT chest 1 year.  . Tobacco abuse    80+ pack-yr hx.  CT screening for lung ca 09/2014 showed multiple nodules <255mm; f/u CT in 1 yr recommended.  . Wrist fracture, closed 2012   right    Past Surgical History:  Procedure Laterality Date  . ABDOMINAL HYSTERECTOMY  approx 1997   Fibroids (ovaries ARE still in)  . HEMORRHOID SURGERY  approx 1982  . RAI THYROID UPTAKE&SCAN  09/12/14   NO dominant hot or cold nodule--diffuse uptake and gland enlargement c/w multinodular goiter.  Marland Kitchen. SHOULDER SURGERY Left 10/2014   RC and bone spurs: pt refused post-op rehab and continued to smoke    Outpatient Medications Prior to Visit  Medication Sig Dispense Refill  . alendronate (FOSAMAX) 70 MG  tablet TAKE 1 TAB BY MOUTH EVERY 7 DAYS. TAKE WITH A FULL GLASS OF WATER ON AN EMPTY STOMACH AND REMAIN SITTING UPRIGHT FOR 30 MINUTES AFTER TAKING 4 tablet 0  . aspirin EC 81 MG tablet Take 81 mg by mouth daily.    Marland Kitchen CALCIUM CARBONATE PO Take by mouth.    . diazepam (VALIUM) 10 MG tablet TAKE ONE TABLET BY MOUTH EVERY 12 HOURS AS NEEDED FOR ANXIETY 60 tablet 5  . DULoxetine (CYMBALTA) 30 MG capsule Take 1 capsule (30 mg total) by mouth daily. 30 capsule 1  . hydrochlorothiazide (HYDRODIURIL) 25 MG tablet TAKE ONE TABLET BY MOUTH DAILY 30 tablet 6  . Multiple Vitamin (MULTIVITAMIN) tablet Take 1 tablet by mouth daily.      . Omega-3 Fatty Acids (FISH OIL) 1200  MG CAPS Take by mouth daily.      . potassium chloride SA (K-DUR,KLOR-CON) 20 MEQ tablet 2 tabs po qd 60 tablet 12  . PROAIR HFA 108 (90 Base) MCG/ACT inhaler     . promethazine (PHENERGAN) 12.5 MG tablet Take 1 tablet (12.5 mg total) by mouth every 6 (six) hours as needed for nausea or vomiting. 30 tablet 6  . ranitidine (ZANTAC) 150 MG tablet Take 1 tablet (150 mg total) by mouth 2 (two) times daily. 60 tablet 12  . oxyCODONE (ROXICODONE) 15 MG immediate release tablet 1 tab po qid prn 120 tablet 0  . simvastatin (ZOCOR) 20 MG tablet Take 1 tablet (20 mg total) by mouth at bedtime. (Patient not taking: Reported on 01/16/2016) 30 tablet 6   No facility-administered medications prior to visit.     Allergies  Allergen Reactions  . Depo-Medrol [Methylprednisolone] Other (See Comments)    Migraine    ROS As per HPI  PE: Blood pressure 106/73, pulse 81, temperature 97.4 F (36.3 C), temperature source Oral, resp. rate 16, height 5' 4.75" (1.645 m), weight 127 lb 12 oz (57.9 kg), SpO2 91 %. Gen: Alert, well appearing.  Patient is oriented to person, place, time, and situation. AFFECT: pleasant, lucid thought and speech. Neck: anterior cervical region with a few firm lymph nodes, R>L: stable compared to last exam.    LABS:    Chemistry      Component Value Date/Time   NA 140 12/19/2015 1454   K 3.9 12/19/2015 1454   CL 100 12/19/2015 1454   CO2 31 12/19/2015 1454   BUN 16 12/19/2015 1454   CREATININE 0.90 12/19/2015 1454   CREATININE 0.80 01/15/2011 1433      Component Value Date/Time   CALCIUM 10.6 (H) 12/19/2015 1454   ALKPHOS 85 08/01/2015 1428   AST 17 08/01/2015 1428   ALT 11 08/01/2015 1428   BILITOT 0.5 08/01/2015 1428     Lab Results  Component Value Date   WBC 12.0 (H) 12/19/2015   HGB 17.1 (H) 12/19/2015   HCT 50.2 (H) 12/19/2015   MCV 89.8 12/19/2015   PLT 286.0 12/19/2015   Lab Results  Component Value Date   TSH 0.56 12/19/2015    IMPRESSION AND  PLAN:  1) Depression: unchanged.  She needs to take her medication correctly. Instructions: Restart your duloxetine (cymbalta) for treatment of depression.  Continue taking it until I see you for follow up again in 6 weeks. Remember, it can take 3-4 weeks to begin to help your depression.  2) Insomnia; failed trazodone and ambien trials in the past.  Says all otc sleep aids do nothing to her at all. I  strongly recommended AGAINST using her diazepam by taking both 10mg  tabs hs.  Warned her of potential resp depression.  Will do trial of seroquel 50mg  qhs in hopes this will help with mood as well as sleep.  3) Chronic pain syndrome:  I printed rx's for oxycodone 15mg  tabs, 1 tab qid prn, #120 today for the next 3 months.  Appropriate fill on/after date was noted on each rx.  Her urine tox screen 3 months ago had appropriate results.  An After Visit Summary was printed and given to the patient.  FOLLOW UP: Return in about 6 weeks (around 02/27/2016) for f/u dep/insom.  Signed:  Santiago BumpersPhil Ece Cumberland, MD           01/16/2016

## 2016-01-16 NOTE — Progress Notes (Signed)
Pre visit review using our clinic review tool, if applicable. No additional management support is needed unless otherwise documented below in the visit note. 

## 2016-01-16 NOTE — Patient Instructions (Signed)
Restart your duloxetine (cymbalta) for treatment of depression.  Continue taking it until I see you for follow up again in 6 weeks. Remember, it can take 3-4 weeks to begin to help your depression.

## 2016-02-07 ENCOUNTER — Telehealth: Payer: Self-pay | Admitting: Family Medicine

## 2016-02-07 NOTE — Telephone Encounter (Signed)
Per Dr. Milinda CaveMcGowen tell pt that he is aware of the new laws and will address them with her at her next f/u visit. Pt advised and voiced understanding.

## 2016-02-07 NOTE — Telephone Encounter (Signed)
Patient wants to know if Dr. Milinda CaveMcGowen knows what the new laws will be on patients receiving long term pain medications

## 2016-02-15 ENCOUNTER — Other Ambulatory Visit (HOSPITAL_COMMUNITY)
Admission: RE | Admit: 2016-02-15 | Discharge: 2016-02-15 | Disposition: A | Discharge status: Home / Self Care | Payer: Medicare Other | Source: Other Acute Inpatient Hospital | Attending: Family Medicine | Admitting: Family Medicine

## 2016-02-15 ENCOUNTER — Ambulatory Visit (INDEPENDENT_AMBULATORY_CARE_PROVIDER_SITE_OTHER): Payer: Medicare Other | Admitting: Family Medicine

## 2016-02-15 ENCOUNTER — Encounter: Payer: Self-pay | Admitting: Family Medicine

## 2016-02-15 VITALS — BP 100/60 | HR 95 | Temp 97.8°F | Resp 18 | Ht 64.75 in | Wt 120.0 lb

## 2016-02-15 DIAGNOSIS — R310 Gross hematuria: Secondary | ICD-10-CM | POA: Diagnosis not present

## 2016-02-15 DIAGNOSIS — N390 Urinary tract infection, site not specified: Secondary | ICD-10-CM

## 2016-02-15 DIAGNOSIS — R319 Hematuria, unspecified: Secondary | ICD-10-CM

## 2016-02-15 DIAGNOSIS — R339 Retention of urine, unspecified: Secondary | ICD-10-CM | POA: Diagnosis not present

## 2016-02-15 DIAGNOSIS — R34 Anuria and oliguria: Secondary | ICD-10-CM | POA: Diagnosis not present

## 2016-02-15 LAB — POC URINALSYSI DIPSTICK (AUTOMATED)
BILIRUBIN UA: NEGATIVE
Blood, UA: NEGATIVE
Glucose, UA: NEGATIVE
KETONES UA: NEGATIVE
LEUKOCYTES UA: NEGATIVE
NITRITE UA: NEGATIVE
Spec Grav, UA: >=1.03
Urobilinogen, UA: 1
pH, UA: 5.5

## 2016-02-15 MED ORDER — SULFAMETHOXAZOLE-TRIMETHOPRIM 800-160 MG PO TABS: 1.0000 | ORAL_TABLET | Freq: Two times a day (BID) | ORAL | 0 refills | Status: AC

## 2016-02-15 MED ORDER — SULFAMETHOXAZOLE-TRIMETHOPRIM 800-160 MG PO TABS: 1.0000 | ORAL_TABLET | Freq: Two times a day (BID) | ORAL | 0 refills | Status: DC

## 2016-02-15 NOTE — Progress Notes (Signed)
Pre visit review using our clinic review tool, if applicable. No additional management support is needed unless otherwise documented below in the visit note. 

## 2016-02-15 NOTE — Progress Notes (Signed)
HPI:   [name] is a 71 y.o. female, who is here today complaining of 5 of urinary symptoms.  + Gross hematuria. Since yesterday she has had decreased in urine frequency, last time she voided was this morning. She denies feeling the urge of urinate. Denies edema or foam in urine.  + Smoker. Hx of COPD.    Dysuria: Denies Urinary frequency: Denies Urinary urgency: Denies Incontinence: Denies    Denies abdominal pain, nausea, vomiting, changes in bowel habits, vaginal bleeding , or discharge.  Hx of UTI: Denies  OTC medications for this problem: None   Lab Results  Component Value Date   CREATININE 0.90 12/19/2015   BUN 16 12/19/2015   NA 140 12/19/2015   K 3.9 12/19/2015   CL 100 12/19/2015   CO2 31 12/19/2015   Noted hyperCa++ 12/19/15, stable for the past year.  FHx : Sister with renal cancer.     Review of Systems  Constitutional: Positive for appetite change (for a while) and fatigue. Negative for activity change, chills and fever.  Cardiovascular: Negative for leg swelling.  Gastrointestinal: Negative for abdominal pain, nausea and vomiting.  Genitourinary: Positive for decreased urine volume, difficulty urinating and hematuria. Negative for dysuria, frequency, urgency, vaginal bleeding and vaginal discharge.  Musculoskeletal: Positive for back pain (chronic).  Skin: Negative for rash.  Psychiatric/Behavioral: Negative for confusion.      Current Outpatient Prescriptions on File Prior to Visit  Medication Sig Dispense Refill  . alendronate (FOSAMAX) 70 MG tablet TAKE 1 TAB BY MOUTH EVERY 7 DAYS. TAKE WITH A FULL GLASS OF WATER ON AN EMPTY STOMACH AND REMAIN SITTING UPRIGHT FOR 30 MINUTES AFTER TAKING 4 tablet 0  . aspirin EC 81 MG tablet Take 81 mg by mouth daily.    Marland Kitchen. CALCIUM CARBONATE PO Take by mouth.    . diazepam (VALIUM) 10 MG tablet TAKE ONE TABLET BY MOUTH EVERY 12 HOURS AS NEEDED FOR ANXIETY 60 tablet 5  . DULoxetine  (CYMBALTA) 30 MG capsule Take 1 capsule (30 mg total) by mouth daily. 30 capsule 1  . hydrochlorothiazide (HYDRODIURIL) 25 MG tablet TAKE ONE TABLET BY MOUTH DAILY 30 tablet 6  . Multiple Vitamin (MULTIVITAMIN) tablet Take 1 tablet by mouth daily.      . Omega-3 Fatty Acids (FISH OIL) 1200 MG CAPS Take by mouth daily.      Marland Kitchen. oxyCODONE (ROXICODONE) 15 MG immediate release tablet 1 tab po qid prn 120 tablet 0  . potassium chloride SA (K-DUR,KLOR-CON) 20 MEQ tablet 2 tabs po qd 60 tablet 12  . PROAIR HFA 108 (90 Base) MCG/ACT inhaler     . promethazine (PHENERGAN) 12.5 MG tablet Take 1 tablet (12.5 mg total) by mouth every 6 (six) hours as needed for nausea or vomiting. 30 tablet 6  . QUEtiapine (SEROQUEL) 50 MG tablet Take 1 tablet (50 mg total) by mouth at bedtime. 30 tablet 3  . ranitidine (ZANTAC) 150 MG tablet Take 1 tablet (150 mg total) by mouth 2 (two) times daily. 60 tablet 12   No current facility-administered medications on file prior to visit.      Past Medical History:  Diagnosis Date  . Abnormal mammogram 09/2014   left (calcifications): repeat diagnostic L mammogram 6 mo ok.  Repeat mammo 10/09/15 also stable.  . Breast nodule 2008   Multiple mammos and u/s's done to follow this nonpalpable nodule on right breast: pt reports it eventually resolved.  Screening mammo neg/nl  01/2009.  Marland Kitchen Chronic left shoulder pain    Impingement syndrome: Ortho at [facility] did MRI and recommended surgery and this was done 2016.  Marland Kitchen Chronic low back pain   . Chronic neck pain   . COPD (chronic obstructive pulmonary disease) (HCC)   . DDD (degenerative disc disease), lumbar    MRI 05/2014.  Pt declined specialist referral AND said she could not afford PT.  . Family history of abdominal aortic aneurysm    sister.  Screening u/s 09/2014 NEG.  Marland Kitchen Herpes zoster 2011  . History of hypokalemia    on HCTZ.  KDUR supplement started 09/20/2014  . Hyperlipidemia 07/2015   simvastatin started 07/2015; pt had  fatigue on this med so self d/c'd it 12/2015 and felt better.  . Hypertension   . IFG (impaired fasting glucose) 07/2015   HbA1c 5.7%  . Multinodular goiter 2006; 2016   Biopsy negative per pt report.  U/s showed multinodular goiter-euthyroid.  Thyroid uptake scan nl except dominant R sided cold nodule (bx neg).  09/2014: RAI uptake and scan NEG, thyroid u/s with minimal change.  10/11/15 repeat u/s again showed minimal change--NO F/U imaging is required anymore.  . Osteoporosis 09/2014  . Pulmonary nodule 2016 &2017   3 mo f/u LTD CT chest nodule f/u w/out contrast done 01/14/16 and showed nodule RESOLVING.  Plan repeat screening noncontrast CT chest 1 year.  . Tobacco abuse    80+ pack-yr hx.  CT screening for lung ca 09/2014 showed multiple nodules <85mm; f/u CT in 1 yr recommended.  . Wrist fracture, closed 2012   right   Allergies  Allergen Reactions  . Depo-Medrol [Methylprednisolone] Other (See Comments)    Migraine  . Simvastatin Other (See Comments)    Fatigue    Social History   Social History  . Marital status: Married    Spouse name: N/A  . Number of children: N/A  . Years of education: N/A   Social History Main Topics  . Smoking status: Heavy Tobacco Smoker    Packs/day: 2.00    Years: 30.00    Types: Cigarettes, E-cigarettes    Last attempt to quit: 07/16/2014  . Smokeless tobacco: Never Used  . Alcohol use No  . Drug use: No  . Sexual activity: Not Asked   Other Topics Concern  . 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   Takes care of wheelchair-bound/chronically ill husband, has a dog in the home.   No exercise.   Long smoking history: approx 65 pack-yr hx; quit 2012.   Financial problems have resulted in fractured/sporadic care for her chronic medical problems.    Vitals:   02/15/16 1214  BP: 100/60  Pulse: 95  Resp: 18  Temp: 97.8 F (36.6 C)   Body mass index is  20.12 kg/m.      Physical Exam  Nursing note and vitals reviewed. Constitutional: She is oriented to person, place, and time. She appears well-developed and well-nourished. She does not appear ill. No distress.  HENT:  Head: Atraumatic.  Eyes: Conjunctivae are normal.  Cardiovascular: Normal rate and regular rhythm.   Respiratory: Effort normal and breath sounds normal. No respiratory distress.  GI: Soft. She exhibits no mass. There is no hepatomegaly. There is tenderness (mild) in the epigastric area. There is no rigidity, no rebound, no guarding and no CVA tenderness.  Musculoskeletal: She exhibits no edema.  Lumbar back: She exhibits no bony tenderness.  Right lumbar paraspinal tenderness with palpation.  Neurological: She is alert and oriented to person, place, and time.  Skin: Skin is warm. No erythema.  Psychiatric: Her speech is normal. Her mood appears anxious.      ASSESSMENT AND PLAN:     Chantae was seen today for urinary retention and hematuria.  Diagnoses and all orders for this visit:  Gross hematuria -     POCT Urinalysis Dipstick (Automated) -     Urine culture  Decreased urination -     POCT Urinalysis Dipstick (Automated) -     Urine culture  Urinary tract infection with hematuria, site unspecified -     Discontinue: sulfamethoxazole-trimethoprim (BACTRIM DS,SEPTRA DS) 800-160 MG tablet; Take 1 tablet by mouth 2 (two) times daily. -     sulfamethoxazole-trimethoprim (BACTRIM DS,SEPTRA DS) 800-160 MG tablet; Take 1 tablet by mouth 2 (two) times daily.   Possible causes of reported urinary symptoms discussed, including malignancy given her Hx of tobacco use. She was able to collet a good sample, no gross hematuria, cloudy urine. Urine dipstick negative for blood.  Ucx ordered.   Empiric abx treatment started today and will be tailored according to Ucx results and susceptibility report.  Clearly instructed about warning signs. Strongly  recommended smoking cessation. F/U in 1-2 weeks with PCP.    -[name] was advised to return or notify a doctor immediately if symptoms worsen or persist or new concerns arise.       Harlee Pursifull G. [place], MD  [facility]. [place] office.

## 2016-02-15 NOTE — Patient Instructions (Signed)
  [name] have seen you today for an acute visit.  1. Gross hematuria  - POCT Urinalysis Dipstick (Automated) - Urine culture  2. Urinary retention  - POCT Urinalysis Dipstick (Automated) - Urine culture  3. Urinary tract infection with hematuria, site unspecified  - sulfamethoxazole-trimethoprim (BACTRIM DS,SEPTRA DS) 800-160 MG tablet; Take 1 tablet by mouth 2 (two) times daily.  Dispense: 10 tablet; Refill: 0     Adequate fluid intake, avoid holding urine for long hours, and over the counter Vit C OR cranberry capsules might help.  Today we will treat empirically with antibiotic, which we might need to change when urine culture comes back depending of bacteria susceptibility.  Seek immediate medical attention if severe abdominal pain, vomiting, fever/chills, or worsening symptoms. F/U if symptomatic are not any better after 2-3 days of antibiotic treatment.  In general please monitor for signs of worsening symptoms and seek immediate medical attention if any concerning/warning symptom as we discussed.   Please follow in 1-2 weeks,before if needed.  Smoking cessation encouraged.

## 2016-02-17 LAB — URINE CULTURE

## 2016-02-18 ENCOUNTER — Encounter: Payer: Self-pay | Admitting: Family Medicine

## 2016-02-25 ENCOUNTER — Ambulatory Visit (INDEPENDENT_AMBULATORY_CARE_PROVIDER_SITE_OTHER): Payer: Medicare Other | Admitting: Family Medicine

## 2016-02-25 ENCOUNTER — Encounter: Payer: Self-pay | Admitting: Family Medicine

## 2016-02-25 VITALS — BP 101/69 | HR 77 | Temp 97.5°F | Resp 16 | Ht 64.75 in | Wt 123.0 lb

## 2016-02-25 DIAGNOSIS — G4700 Insomnia, unspecified: Secondary | ICD-10-CM | POA: Diagnosis not present

## 2016-02-25 DIAGNOSIS — F418 Other specified anxiety disorders: Secondary | ICD-10-CM | POA: Diagnosis not present

## 2016-02-25 DIAGNOSIS — F329 Major depressive disorder, single episode, unspecified: Secondary | ICD-10-CM

## 2016-02-25 DIAGNOSIS — F419 Anxiety disorder, unspecified: Principal | ICD-10-CM

## 2016-02-25 MED ORDER — DULOXETINE HCL 60 MG PO CPEP: 60.0000 mg | ORAL_CAPSULE | Freq: Every day | ORAL | 6 refills | Status: DC

## 2016-02-25 NOTE — Progress Notes (Signed)
OFFICE VISIT  02/25/2016   CC:  Chief Complaint  Patient presents with  . Follow-up    Depression and Insomnia   HPI:    Patient is a 71 y.o. Caucasian female who presents for 6 week f/u depression and insomnia. Duloxetine started last visit for anx/dep. Seroquel 50mg  qhs started for insomnia (hx of being refractory to all other meds tried).  Seroquel helping her sleep great: all through the night. Feels like mood and anxiety are mildly improved.  Still chronically fatigued.  Appetite same (still a bit low). No crying spells.  No panic attacks.    She asks if she can get her pain med rx filled today instead of 2 days from now b/c of the expected snow/possible hazardous roads. I said yes.  Past Medical History:  Diagnosis Date  . Abnormal mammogram 09/2014   left (calcifications): repeat diagnostic L mammogram 6 mo ok.  Repeat mammo 10/09/15 also stable.  . Breast nodule 2008   Multiple mammos and u/s's done to follow this nonpalpable nodule on right breast: pt reports it eventually resolved.  Screening mammo neg/nl 01/2009.  Marland Kitchen. Chronic left shoulder pain    Imping[facility] syndrome: [facility] surgery and this was done 2016.  Marland Kitchen. Chronic low back pain   . Chronic neck pain   . COPD (chronic obstructive pulmonary disease) (HCC)   . DDD (degenerative disc disease), lumbar    MRI 05/2014.  Pt declined specialist referral AND said she could not afford PT.  . Family history of abdominal aortic aneurysm    sister.  Screening u/s 09/2014 NEG.  Marland Kitchen. Herpes zoster 2011  . History of hypokalemia    on HCTZ.  KDUR supplement started 09/20/2014  . Hyperlipidemia 07/2015   simvastatin started 07/2015; pt had fatigue on this med so self d/c'd it 12/2015 and felt better.  . Hypertension   . IFG (impaired fasting glucose) 07/2015   HbA1c 5.7%  . Multinodular goiter 2006; 2016   Biopsy negative per pt report.  U/s showed multinodular goiter-euthyroid.  Thyroid uptake scan nl  except dominant R sided cold nodule (bx neg).  09/2014: RAI uptake and scan NEG, thyroid u/s with minimal change.  10/11/15 repeat u/s again showed minimal change--NO F/U imaging is required anymore.  . Osteoporosis 09/2014  . Pulmonary nodule 2016 &2017   3 mo f/u LTD CT chest nodule f/u w/out contrast done 01/14/16 and showed nodule RESOLVING.  Plan repeat screening noncontrast CT chest 1 year.  . Tobacco abuse    80+ pack-yr hx.  CT screening for lung ca 09/2014 showed multiple nodules <335mm; f/u CT 01/2016 stable.  Repeat 1 yr.  . Wrist fracture, closed 2012   right    Past Surgical History:  Procedure Laterality Date  . ABDOMINAL HYSTERECTOMY  approx 1997   Fibroids (ovaries ARE still in)  . HEMORRHOID SURGERY  approx 1982  . RAI THYROID UPTAKE&SCAN  09/12/14   NO dominant hot or cold nodule--diffuse uptake and gland enlargement c/w multinodular goiter.  Marland Kitchen. SHOULDER SURGERY Left 10/2014   RC and bone spurs: pt refused post-op rehab and continued to smoke    Outpatient Medications Prior to Visit  Medication Sig Dispense Refill  . alendronate (FOSAMAX) 70 MG tablet TAKE 1 TAB BY MOUTH EVERY 7 DAYS. TAKE WITH A FULL GLASS OF WATER ON AN EMPTY STOMACH AND REMAIN SITTING UPRIGHT FOR 30 MINUTES AFTER TAKING 4 tablet 0  . aspirin EC 81 MG tablet  Take 81 mg by mouth daily.    Marland Kitchen CALCIUM CARBONATE PO Take by mouth.    . diazepam (VALIUM) 10 MG tablet TAKE ONE TABLET BY MOUTH EVERY 12 HOURS AS NEEDED FOR ANXIETY 60 tablet 5  . hydrochlorothiazide (HYDRODIURIL) 25 MG tablet TAKE ONE TABLET BY MOUTH DAILY 30 tablet 6  . Multiple Vitamin (MULTIVITAMIN) tablet Take 1 tablet by mouth daily.      . Omega-3 Fatty Acids (FISH OIL) 1200 MG CAPS Take by mouth daily.      Marland Kitchen oxyCODONE (ROXICODONE) 15 MG immediate release tablet 1 tab po qid prn 120 tablet 0  . potassium chloride SA (K-DUR,KLOR-CON) 20 MEQ tablet 2 tabs po qd 60 tablet 12  . PROAIR HFA 108 (90 Base) MCG/ACT inhaler     . promethazine (PHENERGAN)  12.5 MG tablet Take 1 tablet (12.5 mg total) by mouth every 6 (six) hours as needed for nausea or vomiting. 30 tablet 6  . QUEtiapine (SEROQUEL) 50 MG tablet Take 1 tablet (50 mg total) by mouth at bedtime. 30 tablet 3  . ranitidine (ZANTAC) 150 MG tablet Take 1 tablet (150 mg total) by mouth 2 (two) times daily. 60 tablet 12  . DULoxetine (CYMBALTA) 30 MG capsule Take 1 capsule (30 mg total) by mouth daily. 30 capsule 1   No facility-administered medications prior to visit.     Allergies  Allergen Reactions  . Depo-Medrol [Methylprednisolone] Other (See Comments)    Migraine  . Simvastatin Other (See Comments)    Fatigue    ROS As per HPI  PE: Blood pressure 101/69, pulse 77, temperature 97.5 F (36.4 C), temperature source Oral, resp. rate 16, height 5' 4.75" (1.645 m), weight 123 lb (55.8 kg), SpO2 90 %. Gen: Alert, well appearing.  Patient is oriented to person, place, time, and situation. CV: RRR, no m/r/g.   LUNGS: CTA bilat, nonlabored resps, good aeration in all lung fields. EXT: no clubbing, cyanosis, or edema.   LABS:  none  IMPRESSION AND PLAN:  1) Anxiety and depression: improved on duloxetine 30mg  qd.  Tolerating med well. Increase duloxetine to 60mg  qd.  2) Insomnia, hx of resistance to long list of sleep meds.  She has found that seroquel 50mg  qhs helps great and I am very happy for her. Continue seroquel 50mg  qhs.  3) Chronic pain: I had my nurse call and authorize early RF (by 2 days) of her oxycodone in order to avoid any potential problems with snowy/hazardous roads in the next couple days.  An After Visit Summary was printed and given to the patient.   FOLLOW UP: Return in about 6 weeks (around 04/07/2016) for f/u pain syndrome + anx/dep.  Signed:  Santiago Bumpers, MD           02/25/2016

## 2016-02-25 NOTE — Progress Notes (Signed)
Pre visit review using our clinic review tool, if applicable. No additional management support is needed unless otherwise documented below in the visit note. 

## 2016-02-27 ENCOUNTER — Ambulatory Visit: Payer: Medicare Other | Admitting: Family Medicine

## 2016-02-27 ENCOUNTER — Other Ambulatory Visit: Payer: Self-pay | Admitting: Family Medicine

## 2016-02-28 NOTE — Telephone Encounter (Signed)
RF request for fosamax LOV: 02/25/16 Next ov: None Last written: 10/07/15 #4 w/ [id number]  Please advise. Thanks.

## 2016-03-25 ENCOUNTER — Other Ambulatory Visit: Payer: Self-pay | Admitting: Family Medicine

## 2016-04-13 ENCOUNTER — Other Ambulatory Visit: Payer: Self-pay | Admitting: Family Medicine

## 2016-04-13 NOTE — Telephone Encounter (Signed)
Her most recent rx for oxycodone for #120 tabs was on 03/28/16.  This is supposed to last her until 04/25/16. No rx will be written at this time. I do recommend she come in for f/u chronic pain sometime in the next couple of weeks.-thx

## 2016-04-13 NOTE — Telephone Encounter (Signed)
RF request for oxycodone LOV: 02/25/16 Next ov: None Last written: 01/16/16 #120 w/ 0RF  Please advise. Thanks.

## 2016-04-13 NOTE — Telephone Encounter (Signed)
Patient called to request a refill for oxyCODONE (ROXICODONE) 15 MG immediate release tablet [[id number]][[id number]]  . She states that she didn't know if she needs to schedule an appointment or not since she was there in January. Please advise patient on how to proceed.

## 2016-04-14 NOTE — Telephone Encounter (Signed)
Pt advised and voiced understanding.  Apt made for 04/24/16 at 11:00am.

## 2016-04-24 ENCOUNTER — Telehealth: Payer: Self-pay | Admitting: Family Medicine

## 2016-04-24 ENCOUNTER — Other Ambulatory Visit: Payer: Self-pay | Admitting: Family Medicine

## 2016-04-24 ENCOUNTER — Encounter: Payer: Self-pay | Admitting: Family Medicine

## 2016-04-24 ENCOUNTER — Ambulatory Visit (INDEPENDENT_AMBULATORY_CARE_PROVIDER_SITE_OTHER): Payer: Medicare Other | Admitting: Family Medicine

## 2016-04-24 VITALS — BP 116/82 | HR 92 | Temp 97.8°F | Resp 16 | Ht 64.75 in | Wt 111.8 lb

## 2016-04-24 DIAGNOSIS — F418 Other specified anxiety disorders: Secondary | ICD-10-CM | POA: Diagnosis not present

## 2016-04-24 DIAGNOSIS — Z79899 Other long term (current) drug therapy: Secondary | ICD-10-CM | POA: Diagnosis not present

## 2016-04-24 DIAGNOSIS — F329 Major depressive disorder, single episode, unspecified: Secondary | ICD-10-CM

## 2016-04-24 DIAGNOSIS — F419 Anxiety disorder, unspecified: Secondary | ICD-10-CM

## 2016-04-24 DIAGNOSIS — R634 Abnormal weight loss: Secondary | ICD-10-CM | POA: Diagnosis not present

## 2016-04-24 DIAGNOSIS — G894 Chronic pain syndrome: Secondary | ICD-10-CM

## 2016-04-24 DIAGNOSIS — Z7729 Contact with and (suspected ) exposure to other hazardous substances: Secondary | ICD-10-CM

## 2016-04-24 DIAGNOSIS — Z79891 Long term (current) use of opiate analgesic: Secondary | ICD-10-CM | POA: Diagnosis not present

## 2016-04-24 LAB — CBC WITH DIFFERENTIAL/PLATELET
BASOS PCT: 0.6 % (ref 0.0–3.0)
Basophils Absolute: 0 10*3/uL (ref 0.0–0.1)
Eosinophils Absolute: 0.1 10*3/uL (ref 0.0–0.7)
Eosinophils Relative: 1.6 % (ref 0.0–5.0)
HEMATOCRIT: 51.2 % — AB (ref 36.0–46.0)
HEMOGLOBIN: 17.5 g/dL — AB (ref 12.0–15.0)
LYMPHS PCT: 38.9 % (ref 12.0–46.0)
Lymphs Abs: 2.7 10*3/uL (ref 0.7–4.0)
MCHC: 34.1 g/dL (ref 30.0–36.0)
MCV: 89.5 fl (ref 78.0–100.0)
Monocytes Absolute: 0.5 10*3/uL (ref 0.1–1.0)
Monocytes Relative: 6.8 % (ref 3.0–12.0)
Neutro Abs: 3.6 10*3/uL (ref 1.4–7.7)
Neutrophils Relative %: 52.1 % (ref 43.0–77.0)
Platelets: 248 10*3/uL (ref 150.0–400.0)
RBC: 5.73 Mil/uL — ABNORMAL HIGH (ref 3.87–5.11)
RDW: 13 % (ref 11.5–15.5)
WBC: 6.9 10*3/uL (ref 4.0–10.5)

## 2016-04-24 LAB — C-REACTIVE PROTEIN: CRP: 0.4 mg/dL — ABNORMAL LOW (ref 0.5–20.0)

## 2016-04-24 LAB — COMPREHENSIVE METABOLIC PANEL
ALBUMIN: 4 g/dL (ref 3.5–5.2)
ALK PHOS: 85 U/L (ref 39–117)
ALT: 17 U/L (ref 0–35)
AST: 13 U/L (ref 0–37)
BILIRUBIN TOTAL: 0.5 mg/dL (ref 0.2–1.2)
BUN: 5 mg/dL — AB (ref 6–23)
CO2: 31 mEq/L (ref 19–32)
Calcium: 10.1 mg/dL (ref 8.4–10.5)
Chloride: 97 mEq/L (ref 96–112)
Creatinine, Ser: 0.76 mg/dL (ref 0.40–1.20)
GFR: 80.32 mL/min (ref >60.00)
Glucose, Bld: 101 mg/dL — ABNORMAL HIGH (ref 70–99)
POTASSIUM: 3.8 meq/L (ref 3.5–5.1)
SODIUM: 134 meq/L — AB (ref 135–145)
TOTAL PROTEIN: 6.9 g/dL (ref 6.0–8.3)

## 2016-04-24 LAB — TSH: TSH: 0.43 u[IU]/mL (ref 0.35–4.50)

## 2016-04-24 LAB — SEDIMENTATION RATE: SED RATE: 12 mm/h (ref 0–30)

## 2016-04-24 MED ORDER — DULOXETINE HCL 30 MG PO CPEP: 30.0000 mg | ORAL_CAPSULE | Freq: Every day | ORAL | 6 refills | Status: AC

## 2016-04-24 MED ORDER — OXYCODONE HCL 15 MG PO TABS: ORAL_TABLET | ORAL | 0 refills | Status: DC

## 2016-04-24 NOTE — Telephone Encounter (Signed)
OK to fill today

## 2016-04-24 NOTE — Progress Notes (Signed)
Pre visit review using our clinic review tool, if applicable. No additional management support is needed unless otherwise documented below in the visit note. 

## 2016-04-24 NOTE — Telephone Encounter (Signed)
Debbie at [facility] advised.

## 2016-04-24 NOTE — Telephone Encounter (Signed)
Pharmacy Tech calling to find out if it's ok to fill the patients prescription early.   Thank you,  -LL

## 2016-04-24 NOTE — Progress Notes (Signed)
OFFICE VISIT  04/24/2016   CC:  Chief Complaint  Patient presents with  . Follow-up    RCI,    HPI:    Patient is a 71 y.o. 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 I rx her narcotic pain med on a regular basis to optimize function and improve quality of life. Also f/u chronic depression and anxiety.  Last visit we increased her duloxetine to 74m qd. She notes that she feels some disequilibrium and "can't walk a straight line" since the increase to 65mqd.  She then went back to the 3029md dosing and her side effects essentially resolved.  Mood and chronic anxiety are mildly improved.   Tob dependence: 1.5 packs per day still.  She is not too concerned about totally quitting.  Indication for chronic opioid: see above Medication and dose: oxycodone 39m27m qid prn # pills per month: 120 Last UDS date: 11/13/14 Pain contract signed (Y/N): yes--11/28/12. Date narcotic database last reviewed (include red flags): none  Most recent oxycodone: "last one this morning". Most recent diazepam: last night.  Has poor appetite lately, has dysphagia secondary to thyroid compressing esophagus.   No abd pain, no n/v.  No diarrhea.   Her weight has steadily dropped over the last 2 yrs--was 156 lbs in July 2016, and today she weighs 112 lbs. She does not exercise or diet.  No hemoptysis, but has chronic mild cough that is minimally productive.  No wheezing, SOB, or chest pain.  Past Medical History:  Diagnosis Date  . Abnormal mammogram 09/2014   left (calcifications): repeat diagnostic L mammogram 6 mo ok.  Repeat mammo 10/09/15 also stable.  . Breast nodule 2008   Multiple mammos and u/s's done to follow this nonpalpable nodule on right breast: pt reports it eventually resolved.  Screening mammo neg/nl 01/2009.  . ChMarland Kitchenonic left shoulder pain    Impingement syndrome: [facility] and recommended surgery and this was  done 2016.  . ChMarland Kitchenonic low back pain   . Chronic neck pain   . COPD (chronic obstructive pulmonary disease) (HCC)[place]. DDD (degenerative disc disease), lumbar    MRI 05/2014.  Pt declined specialist referral AND said she could not afford PT.  . Family history of abdominal aortic aneurysm    sister.  Screening u/s 09/2014 NEG.  . HeMarland Kitchenpes zoster 2011  . History of hypokalemia    on HCTZ.  KDUR supplement started 09/20/2014  . Hyperlipidemia 07/2015   simvastatin started 07/2015; pt had fatigue on this med so self d/c'd it 12/2015 and felt better.  . Hypertension   . IFG (impaired fasting glucose) 07/2015   HbA1c 5.7%  . Multinodular goiter 2006; 2016   Biopsy negative per pt report.  U/s showed multinodular goiter-euthyroid.  Thyroid uptake scan nl except dominant R sided cold nodule (bx neg).  09/2014: RAI uptake and scan NEG, thyroid u/s with minimal change.  10/11/15 repeat u/s again showed minimal change--NO F/U imaging is required anymore.  . Osteoporosis 09/2014  . Pulmonary nodule 2016 &2017   3 mo f/u LTD CT chest nodule f/u w/out contrast done 01/14/16 and showed nodule RESOLVING.  Plan repeat screening noncontrast CT chest 1 year.  . Tobacco abuse    80+ pack-yr hx.  CT screening for lung ca 09/2014 showed multiple nodules <5mm;35mu CT 01/2016 stable.  Repeat 1 yr.  . Wrist fracture, closed 2012  right    Past Surgical History:  Procedure Laterality Date  . ABDOMINAL HYSTERECTOMY  approx 1997   Fibroids (ovaries ARE still in)  . HEMORRHOID SURGERY  approx 1982  . RAI THYROID UPTAKE&SCAN  09/12/14   NO dominant hot or cold nodule--diffuse uptake and gland enlargement c/w multinodular goiter.  Marland Kitchen SHOULDER SURGERY Left 10/2014   RC and bone spurs: pt refused post-op rehab and continued to smoke    Outpatient Medications Prior to Visit  Medication Sig Dispense Refill  . alendronate (FOSAMAX) 70 MG tablet TAKE 1 TAB BY MOUTH EVERY 7 DAYS. TAKE WITH A FULL GLASS OF WATER ON AN EMPTY STOMACH  AND REMAIN SITTING UPRIGHT FOR 30 MINUTES AFTER TAKING 4 tablet 12  . aspirin EC 81 MG tablet Take 81 mg by mouth daily.    Marland Kitchen CALCIUM CARBONATE PO Take by mouth.    . diazepam (VALIUM) 10 MG tablet TAKE ONE TABLET BY MOUTH EVERY 12 HOURS AS NEEDED FOR ANXIETY 60 tablet 5  . hydrochlorothiazide (HYDRODIURIL) 25 MG tablet TAKE ONE TABLET BY MOUTH DAILY 30 tablet 6  . Multiple Vitamin (MULTIVITAMIN) tablet Take 1 tablet by mouth daily.      . Omega-3 Fatty Acids (FISH OIL) 1200 MG CAPS Take by mouth daily.      . potassium chloride SA (K-DUR,KLOR-CON) 20 MEQ tablet 2 tabs po qd 60 tablet 12  . promethazine (PHENERGAN) 12.5 MG tablet Take 1 tablet (12.5 mg total) by mouth every 6 (six) hours as needed for nausea or vomiting. 30 tablet 6  . QUEtiapine (SEROQUEL) 50 MG tablet Take 1 tablet (50 mg total) by mouth at bedtime. 30 tablet 3  . ranitidine (ZANTAC) 150 MG tablet Take 1 tablet (150 mg total) by mouth 2 (two) times daily. 60 tablet 12  . DULoxetine (CYMBALTA) 60 MG capsule Take 1 capsule (60 mg total) by mouth daily. 30 capsule 6  . oxyCODONE (ROXICODONE) 15 MG immediate release tablet 1 tab po qid prn 120 tablet 0  . PROAIR HFA 108 (90 Base) MCG/ACT inhaler     . alendronate (FOSAMAX) 70 MG tablet TAKE 1 TAB BY MOUTH EVERY 7 DAYS. TAKE WITH A FULL GLASS OF WATER ON AN EMPTY STOMACH AND REMAIN SITTING UPRIGHT FOR 30 MINUTES AFTER TAKING (Patient not taking: Reported on 04/24/2016) 4 tablet 0   No facility-administered medications prior to visit.     Allergies  Allergen Reactions  . Depo-Medrol [Methylprednisolone] Other (See Comments)    Migraine  . Simvastatin Other (See Comments)    Fatigue    ROS As per HPI  PE: Blood pressure 116/82, pulse 92, temperature 97.8 F (36.6 C), temperature source Oral, resp. rate 16, height 5' 4.75" (1.645 m), weight 111 lb 12 oz (50.7 kg), SpO2 95 %. Gen: Alert, well appearing.  Patient is oriented to person, place, time, and situation. AFFECT:  pleasant, lucid thought and speech. CV: RRR, no m/r/g.   LUNGS: CTA bilat, nonlabored resps, good aeration in all lung fields. EXT: no clubbing, cyanosis, or edema.     LABS:    Chemistry      Component Value Date/Time   NA 140 12/19/2015 1454   K 3.9 12/19/2015 1454   CL 100 12/19/2015 1454   CO2 31 12/19/2015 1454   BUN 16 12/19/2015 1454   CREATININE 0.90 12/19/2015 1454   CREATININE 0.80 01/15/2011 1433      Component Value Date/Time   CALCIUM 10.6 (H) 12/19/2015 1454   ALKPHOS 85  08/01/2015 1428   AST 17 08/01/2015 1428   ALT 11 08/01/2015 1428   BILITOT 0.5 08/01/2015 1428     IMPRESSION AND PLAN:  1) Chronic pain syndrome: The current medical regimen is effective;  continue present plan and medications. She declines referral to pain mgmt clinic once again.  I have told her multiple times that I will not give her stronger med or give her more pills than I currently give.  Urine toxicology screen done today--expect to see oxycodone and diazepam.  Gave rx today for oxycodone 65m, 1 tab qid prn, #120.  2) Anxiety and depression: mild improvement on duloxetine 315mqd---we'll give this longer to help. 60 mg qd dosing caused disequilibrium side effect.  3) Abnormal weight loss: of late, she has had some dysphagia related to her thyroid compressing her esophagus, and she says she thinks she eats less than she used to b/c of this.  However, this still doesn't explain the gradual wt loss over the last 2 yrs.  Chest CT 01/14/16 to f/u pulm nodule showed stability--repeat screening with low dose chest CT w/out contrast recommended in 1 yr. Recent re-imaging of her solitary R thyroid nodule showed stability, and since she had a previous bx that was neg for malignancy, radiology suggested no repeat biopsy or dedicated imaging. Will recheck TSH, CBC, CMET, ESR, and CRP. I recommended she drink one Ensure every day.  An After Visit Summary was printed and given to the  patient.  FOLLOW UP: Return in about 4 weeks (around 05/22/2016) for abnormal weight loss.  Signed:  PhCrissie SicklesMD           04/24/2016

## 2016-04-24 NOTE — Telephone Encounter (Signed)
[facility] and she stated that pt last filled Rx on 03/28/16 but since february only has 28 day pt should still have 12 tablets left since she was given a 30 day supply. Eunice BlaseDebbie stated that pts Rx isn't due to be filled until Monday (04/27/16). Eunice BlaseDebbie stated that pt told her that she was out of this medication. Please advise. Thanks.

## 2016-04-24 NOTE — Patient Instructions (Signed)
Drink 1 Ensure daily

## 2016-04-24 NOTE — Telephone Encounter (Signed)
Pls see if you can add on a lab to blood drawn today (04/24/16).  It is a [facility] lab test and I have entered it as a future test already just in case it can't be added on. It is a carboxyhemoglobin level--dx is carbon monoxide exposure. If it cannot be added on, pls call pt and have her schedule lab visit to get this test.  Reassure her that this is a blood draw from a vein, not an artery (I told her during her office visit that I think the only way to test this is by getting a radial artery sample, but I was wrong).--thx

## 2016-04-27 NOTE — Telephone Encounter (Signed)
Advised patient of information.  She states she will CB to schedule lab only visit for test.

## 2016-04-27 NOTE — Telephone Encounter (Signed)
Pls call pt and have her arrange lab visit.  Fasting is NOT necessary. I've ordered a carboxyhemoglobin level to look into her question about carbon monoxide exposure, pls just release this. Reassure her that this is a blood draw from a vein, not an artery (I told her during her office visit that I think the only way to test this is by getting a radial artery sample, but I was wrong).--thx

## 2016-04-27 NOTE — Telephone Encounter (Signed)
Sorry, just now seeing this.   Unfortunately, no it can't be added on.  It has to be collected and refrigerated right away upon collection in lavender tube and [facility] recommended recollection since specimen was drawn Friday.   Please advise.

## 2016-04-28 ENCOUNTER — Telehealth: Payer: Self-pay | Admitting: Family Medicine

## 2016-04-28 NOTE — Telephone Encounter (Signed)
Patient is calling for about abnormal lab results. She would like a call back.  Thank you,  -LL

## 2016-04-28 NOTE — Telephone Encounter (Signed)
Reviewed labs again with pt and she voiced understanding.

## 2016-04-30 ENCOUNTER — Other Ambulatory Visit: Payer: Self-pay | Admitting: Family Medicine

## 2016-05-21 ENCOUNTER — Encounter: Payer: Self-pay | Admitting: Family Medicine

## 2016-05-21 ENCOUNTER — Ambulatory Visit (INDEPENDENT_AMBULATORY_CARE_PROVIDER_SITE_OTHER): Payer: Medicare Other | Admitting: Family Medicine

## 2016-05-21 VITALS — BP 115/74 | HR 92 | Temp 97.6°F | Resp 16 | Ht 64.75 in | Wt 112.2 lb

## 2016-05-21 DIAGNOSIS — F418 Other specified anxiety disorders: Secondary | ICD-10-CM | POA: Diagnosis not present

## 2016-05-21 DIAGNOSIS — G250 Essential tremor: Secondary | ICD-10-CM | POA: Diagnosis not present

## 2016-05-21 DIAGNOSIS — F329 Major depressive disorder, single episode, unspecified: Secondary | ICD-10-CM

## 2016-05-21 DIAGNOSIS — G894 Chronic pain syndrome: Secondary | ICD-10-CM

## 2016-05-21 DIAGNOSIS — D751 Secondary polycythemia: Secondary | ICD-10-CM

## 2016-05-21 DIAGNOSIS — R42 Dizziness and giddiness: Secondary | ICD-10-CM

## 2016-05-21 DIAGNOSIS — Z7729 Contact with and (suspected ) exposure to other hazardous substances: Secondary | ICD-10-CM

## 2016-05-21 DIAGNOSIS — R5382 Chronic fatigue, unspecified: Secondary | ICD-10-CM

## 2016-05-21 DIAGNOSIS — R634 Abnormal weight loss: Secondary | ICD-10-CM

## 2016-05-21 DIAGNOSIS — D72829 Elevated white blood cell count, unspecified: Secondary | ICD-10-CM

## 2016-05-21 DIAGNOSIS — F419 Anxiety disorder, unspecified: Secondary | ICD-10-CM

## 2016-05-21 LAB — CBC WITH DIFFERENTIAL/PLATELET
BASOS ABS: 76 {cells}/uL (ref 0–200)
Basophils Relative: 1 %
EOS ABS: 152 {cells}/uL (ref 15–500)
Eosinophils Relative: 2 %
HEMATOCRIT: 48.2 % — AB (ref 35.0–45.0)
Hemoglobin: 16.4 g/dL — ABNORMAL HIGH (ref 11.7–15.5)
LYMPHS PCT: 33 %
Lymphs Abs: 2508 cells/uL (ref 850–3900)
MCH: 30.7 pg (ref 27.0–33.0)
MCHC: 34 g/dL (ref 32.0–36.0)
MCV: 90.1 fL (ref 80.0–100.0)
MONO ABS: 532 {cells}/uL (ref 200–950)
MONOS PCT: 7 %
MPV: 10.1 fL (ref 7.5–12.5)
Neutro Abs: 4332 cells/uL (ref 1500–7800)
Neutrophils Relative %: 57 %
PLATELETS: 288 10*3/uL (ref 140–400)
RBC: 5.35 MIL/uL — ABNORMAL HIGH (ref 3.80–5.10)
RDW: 13.1 % (ref 11.0–15.0)
WBC: 7.6 10*3/uL (ref 3.8–10.8)

## 2016-05-21 MED ORDER — OXYCODONE HCL 15 MG PO TABS
ORAL_TABLET | ORAL | 0 refills | Status: DC
Start: 2016-05-21 — End: 2016-07-20

## 2016-05-21 MED ORDER — OXYCODONE HCL 15 MG PO TABS: ORAL_TABLET | ORAL | 0 refills | Status: DC

## 2016-05-21 NOTE — Progress Notes (Signed)
Pre visit review using our clinic review tool, if applicable. No additional management support is needed unless otherwise documented below in the visit note. 

## 2016-05-21 NOTE — Progress Notes (Signed)
OFFICE VISIT  05/21/2016   CC:  Chief Complaint  Patient presents with  . Follow-up    abnormal weight loss   HPI:    Patient is a 71 y.o. Caucasian female who presents for 1 mo f/u abnormal weight loss + anxiety and depression. Stayed at  cymbalta last visit to give it more time (she didn't tolerate  qd dosing). General lab monitoring last visit remarkable only for a chronic erythrocytosis that I believe is due to chronic high intake/use of cigarettes. Imaging of chest and thyroid have not revealed any concerning findings for malignancy.  Mammo UTD, breast calcifications stable, f/u mammo recommended 1 yr (due 09/2016). She has never had a colonoscopy.  iFob given to pt in 2016 for colon ca screening but she never turned this in. She has had a hysterectomy for benign reasons (ovaries were not removed).  Complains of orthostatic dizziness lately, lasts 30-60 sec after getting up.  Gets intermittent tremulousness, not daily--"hands, body".  Says diazepam helps it.  Onset was about 2 yrs ago.  No episodes of vertigo.  No numbness in feet.  No nausea with this.  No falls.  She drinks only Dr. Reino Kent during the day---12 PER DAY!  No water.  Appetite lately is improved some, says she is eating a bit more than before.  She doesn't like ensure so she is trying nesquick high protein drink.  No n/v/d since I last saw her.    Anxiety and mood are a lot better than last visit--stable on current 30 mg cymbalta dosing. She saw her husband at [facility] yesterday and he cussed her out and this did not bother her. She has no family or friends to confide in.  She does not attend church. Spends most of her hours in the day laying down and watching TV due to her neck and back pain bothering her so much.  Got a new pillow and this has helped the pain in her neck.  Past Medical History:  Diagnosis Date  . Abnormal mammogram 09/2014   left (calcifications): repeat diagnostic L mammogram 6 mo ok.  Repeat  mammo 10/09/15 also stable.  . Breast nodule 2008   Multiple mammos and u/s's done to follow this nonpalpable nodule on right breast: pt reports it eventually resolved.  Screening mammo neg/nl 01/2009.  Marland Kitchen Chronic left shoulder pain    Impingement syndrome: Ortho at [facility] did MRI and recommended surgery and this was done 2016.  Marland Kitchen Chronic low back pain   . Chronic neck pain   . COPD (chronic obstructive pulmonary disease) (HCC)   . DDD (degenerative disc disease), lumbar    MRI 05/2014.  Pt declined specialist referral AND said she could not afford PT.  . Family history of abdominal aortic aneurysm    sister.  Screening u/s 09/2014 NEG.  Marland Kitchen Herpes zoster 2011  . History of hypokalemia    on HCTZ.  KDUR supplement started 09/20/2014  . Hyperlipidemia 07/2015   simvastatin started 07/2015; pt had fatigue on this med so self d/c'd it 12/2015 and felt better.  . Hypertension   . IFG (impaired fasting glucose) 07/2015   HbA1c 5.7%  . Multinodular goiter 2006; 2016   Biopsy negative per pt report.  U/s showed multinodular goiter-euthyroid.  Thyroid uptake scan nl except dominant R sided cold nodule (bx neg).  09/2014: RAI uptake and scan NEG, thyroid u/s with minimal change.  10/11/15 repeat u/s again showed minimal change--NO F/U imaging is required anymore.  Marland Kitchen  Osteoporosis 09/2014  . Polycythemia, secondary    presumed secondary to chronic high volume cigarette smoking  . Pulmonary nodule 2016 &2017   3 mo f/u LTD CT chest nodule f/u w/out contrast done 01/14/16 and showed nodule RESOLVING.  Plan repeat screening noncontrast CT chest 1 year.  . Tobacco abuse    80+ pack-yr hx.  CT screening for lung ca 09/2014 showed multiple nodules <41mm; f/u CT 01/2016 stable.  Repeat 1 yr.  . Wrist fracture, closed 2012   right    Past Surgical History:  Procedure Laterality Date  . ABDOMINAL HYSTERECTOMY  approx 1997   Fibroids (ovaries ARE still in)  . HEMORRHOID SURGERY  approx 1982  . RAI THYROID UPTAKE&SCAN   09/12/14   NO dominant hot or cold nodule--diffuse uptake and gland enlargement c/w multinodular goiter.  Marland Kitchen SHOULDER SURGERY Left 10/2014   RC and bone spurs: pt refused post-op rehab and continued to smoke    Outpatient Medications Prior to Visit  Medication Sig Dispense Refill  . alendronate (FOSAMAX) 70 MG tablet TAKE 1 TAB BY MOUTH EVERY 7 DAYS. TAKE WITH A FULL GLASS OF WATER ON AN EMPTY STOMACH AND REMAIN SITTING UPRIGHT FOR 30 MINUTES AFTER TAKING 4 tablet 12  . aspirin EC 81 MG tablet Take 81 mg by mouth daily.    Marland Kitchen CALCIUM CARBONATE PO Take by mouth.    . diazepam (VALIUM) 10 MG tablet TAKE ONE TABLET BY MOUTH EVERY 12 HOURS AS NEEDED FOR ANXIETY 60 tablet 5  . DULoxetine (CYMBALTA) 30 MG capsule Take 1 capsule (30 mg total) by mouth daily. 30 capsule 6  . hydrochlorothiazide (HYDRODIURIL) 25 MG tablet TAKE ONE TABLET BY MOUTH DAILY 30 tablet 6  . Multiple Vitamin (MULTIVITAMIN) tablet Take 1 tablet by mouth daily.      . Omega-3 Fatty Acids (FISH OIL) 1200 MG CAPS Take by mouth daily.      Marland Kitchen oxyCODONE (ROXICODONE) 15 MG immediate release tablet 1 tab po qid prn 120 tablet 0  . potassium chloride SA (K-DUR,KLOR-CON) 20 MEQ tablet 2 tabs po qd 60 tablet 12  . PROAIR HFA 108 (90 Base) MCG/ACT inhaler     . promethazine (PHENERGAN) 12.5 MG tablet Take 1 tablet (12.5 mg total) by mouth every 6 (six) hours as needed for nausea or vomiting. 30 tablet 6  . QUEtiapine (SEROQUEL) 50 MG tablet TAKE ONE TABLET BY MOUTH AT BEDTIME 30 tablet 3  . ranitidine (ZANTAC) 150 MG tablet Take 1 tablet (150 mg total) by mouth 2 (two) times daily. 60 tablet 12   No facility-administered medications prior to visit.     Allergies  Allergen Reactions  . Depo-Medrol [Methylprednisolone] Other (See Comments)    Migraine  . Simvastatin Other (See Comments)    Fatigue    ROS As per HPI  PE: Blood pressure 115/74, pulse 92, temperature 97.6 F (36.4 C), temperature source Oral, resp. rate 16, height  5' 4.75" (1.645 m), weight 112 lb 4 oz (50.9 kg), SpO2 93 %. Orthostatic bp and pulses checked: no significant change in either parameter when going from supine to upright, to standing position. Gen: Alert, well appearing.  Patient is oriented to person, place, time, and situation. AFFECT: pleasant, lucid thought and speech. RUE:AVWU: no injection, icteris, swelling, or exudate.  EOMI, PERRLA. Mouth: lips without lesion/swelling.  Oral mucosa pink and moist. Oropharynx without erythema, exudate, or swelling.  CV: RRR, no m/r/g.   LUNGS: CTA bilat, nonlabored resps, good aeration in  all lung fields. Neuro: CN 2-12 intact bilaterally, strength 5/5 in proximal and distal upper extremities and lower extremities bilaterally.   Trace hand/fingers tremor when hands held outstretched.  No pill rolling tremor.  No intention tremor..    No ataxia.  Upper extremity and lower extremity DTRs symmetric.  No pronator drift. EXT: no clubbing, cyanosis, or edema.    LABS:  Lab Results  Component Value Date   TSH 0.43 04/24/2016   Lab Results  Component Value Date   WBC 6.9 04/24/2016   HGB 17.5 (H) 04/24/2016   HCT 51.2 (H) 04/24/2016   MCV 89.5 04/24/2016   PLT 248.0 04/24/2016   Lab Results  Component Value Date   CREATININE 0.76 04/24/2016   BUN 5 (L) 04/24/2016   NA 134 (L) 04/24/2016   K 3.8 04/24/2016   CL 97 04/24/2016   CO2 31 04/24/2016   Lab Results  Component Value Date   ALT 17 04/24/2016   AST 13 04/24/2016   ALKPHOS 85 04/24/2016   BILITOT 0.5 04/24/2016   Lab Results  Component Value Date   CHOL 231 (H) 08/01/2015   Lab Results  Component Value Date   HDL 47.20 08/01/2015   Lab Results  Component Value Date   LDLCALC 151 (H) 08/01/2015   Lab Results  Component Value Date   TRIG 168.0 (H) 08/01/2015   Lab Results  Component Value Date   CHOLHDL 5 08/01/2015   Lab Results  Component Value Date   HGBA1C 5.7 08/02/2015   Lab Results  Component Value Date    ESRSEDRATE 12 04/24/2016   Lab Results  Component Value Date   CRP 0.4 (L) 04/24/2016    IMPRESSION AND PLAN:  1) Abnormal weight loss-stabilizing.  Appetite and PO intake is picking up. W/u thus far unrevealing: see HPI. Repeat CBC w/diff today and release labs ordered in the past --intact PTH w/calcium, vit B12, methylmalonic acid, vit D, carboxyhemoglobin, pathologist smear review. Given stability, no further w/u at this time, but need to recommend colonoscopy or SOME KIND of colon ca screening in near future.. If abnormal wt loss continues to be a concern, consider pelvic u/s to eval ovaries.  2) Anxiety and depression: The current medical regimen is effective;  continue present plan and medications.  3) Orthostatic dizziness: she definitely needs to get better hydrated: instructions--Every 3 days, substitute an 8 oz glass of water for 1 Dr. Reino Kent. Do this until you are down to 2-3 Dr. Alcus Dad per day MAXIMUM. Stop your hctz (blood pressure medication) and your potassium pill.  4) Chronic pain syndrome: this was officially followed up at last visit and I gave only 1 rx for her pain meds. I gave an additional 3 rx's today for oxycodone , 1 qid prn, #120.  Rx's with appropriate fill on/after dates.  5) Essential tremor: partially anxiety related.  No change in diazepam dosing. Reassured pt, watchful waiting at this time.  6) Erythrocytosis: check CBC with vit B12 and pathologist smear review. Suspect this is secondary to mild hypoxia from chronic smoking.  Check carboxyhemoglobin level. If erythrocytosis is worsening, will refer to hematology.  An After Visit Summary was printed and given to the patient.  FOLLOW UP:  1mo f/u pain and abnl wt loss.  Signed:  Santiago Bumpers, MD           05/21/2016

## 2016-05-21 NOTE — Patient Instructions (Addendum)
Every 3 days, substitute an 8 oz glass of water for 1 Dr. Reino Kent. Do this until you are down to 2-3 Dr. Alcus Dad per day MAXIMUM.  Stop your hctz (blood pressure medication) and your potassium pill.

## 2016-05-22 LAB — VITAMIN D 25 HYDROXY (VIT D DEFICIENCY, FRACTURES): VITD: 35.96 ng/mL (ref 30.00–100.00)

## 2016-05-22 LAB — VITAMIN B12: VITAMIN B 12: 951 pg/mL — AB (ref 211–911)

## 2016-05-22 LAB — PTH, INTACT AND CALCIUM
CALCIUM: 10 mg/dL (ref 8.6–10.4)
PTH: 47 pg/mL (ref 14–64)

## 2016-05-23 ENCOUNTER — Telehealth: Payer: Self-pay | Admitting: Family Medicine

## 2016-05-23 LAB — CARBOXYHEMOGLOBIN: CARBOXYHEMOGLOBIN: 23 %{Hb} — AB (ref <=12)

## 2016-05-23 NOTE — Telephone Encounter (Signed)
I was contacted by [facility] regarding pt's critically high carboxyhemoglobin.  Her level (drawn on Thursday but called as a critical today) is 23- twice the level it should be for a heavy smoker and greater than the potentially toxic level of 15.  I called the pt and informed her of her labs and instructed her to 1) get out of the house and 2) go to the ER.  Pt told me that her furnace has been broken all winter and she has been using Kerosene.  She denied current headache or CP but does admit to recent confusion.  She was reluctant to go to the ER but after some discussion- including the possible serious and fatal consequences of carbon monoxide poisoning- she agreed to go.  She was going to call her sister to take her.  I instructed her to wait outside for her sister and get out of the house.  I offered to call the fire department for her- she declined.  She agreed to wait outside for her sister and get out of the house.  I recommended that she find another place to stay until the fire department can check her house.  Pt thanked me for the call

## 2016-05-25 ENCOUNTER — Telehealth: Payer: Self-pay | Admitting: *Deleted

## 2016-05-25 LAB — METHYLMALONIC ACID, SERUM: Methylmalonic Acid, Quant: 165 nmol/L (ref 87–318)

## 2016-05-25 LAB — PATHOLOGIST SMEAR REVIEW

## 2016-05-25 NOTE — Telephone Encounter (Signed)
Noted  

## 2016-05-25 NOTE — Telephone Encounter (Signed)
Pt contacted by Dr. Beverely Low. See phone note in EMR.

## 2016-05-25 NOTE — Telephone Encounter (Signed)
PLEASE NOTE: All timestamps contained within this report are represented as [place] Standard Time. CONFIDENTIALTY NOTICE: This fax transmission is intended only for the addressee. It contains information that is legally privileged, confidential or otherwise protected from use or disclosure. If you are not the intended recipient, you are strictly prohibited from reviewing, disclosing, copying using or disseminating any of this information or taking any action in reliance on or regarding this information. If you have received this fax in error, please notify us immediately by telephone so that we can arrange for its return to [place]. Phone: [phone], Toll-Free: [phone], Fax: [phone] Page: 1 of 2 Call Id: [id number] [facility] Night - Client TELEPHONE ADVICE RECORD [facility] Medical Call Center Patient Name: Mckenzie Peters Gender: Female DOB: [birth year] Age: 71 Y 7 M 1 D Return Phone Number: [phone] (Primary) City/State/Zip: [place] Client [facility] Night - Client Client Site [facility] Night Physician Santiago Bumpers - MD Who Is Calling Lab Lab Name [facility] Lab Phone Number [phone] option 2 Lab Tech Name Inetta Fermo Lab Reference Number [phone] Chief Complaint Lab Result (Critical or Stat) Call Type Lab Send to RN Reason for Call Report lab results Initial Comment Inetta Fermo from [facility] Lab option 2 says has a critical lab report. Ref # N [id number] Additional Comment Nurse Assessment Nurse: Josie Saunders, RN, Erskine Squibb Date/Time Lamount Cohen Time): 05/23/2016 5:36:18 PM Is there an on-call provider listed? ---Yes Please list name of person reporting value (Lab Employee) and a contact number. ---Inetta Fermo from [facility] [phone] option 2 says has a critical lab report. Ref # N [id number] Please document the following items: Lab name Lab value (read back to lab to verify) Reference range for lab value Date and time blood was  drawn Collect time of birth for bilirubin results ---Carboxyhemoglobin 23 repeated and verified Ranges non smoker < 2 , average smoker 4-5, heavy smoker 8-12, potentially toxic > 15 Collected 04/12 at 3 26 PM. Please collect the patient contact information from the lab. (name, phone number and address) ---[phone] Inetta Fermo List any special notes provided by lab. ---Repeated and verified Nurse: Josie Saunders RN, Erskine Squibb Date/Time Lamount Cohen Time): 05/23/2016 5:29:49 PM Confirm and document reason for call. If symptomatic, describe symptoms. ---Inetta Fermo from [facility] option 2 says has a critical lab report. Ref # N [id number] Please document clinical information provided and list any resource used. ---N/A Guidelines Guideline Title Affirmed Question Disp. Time Lamount Cohen Time) Disposition Final User 05/23/2016 5:56:30 PM Clinical Call Yes Josie Saunders, RN, Denna Haggard NOTE: All timestamps contained within this report are represented as [place] Standard Time. CONFIDENTIALTY NOTICE: This fax transmission is intended only for the addressee. It contains information that is legally privileged, confidential or otherwise protected from use or disclosure. If you are not the intended recipient, you are strictly prohibited from reviewing, disclosing, copying using or disseminating any of this information or taking any action in reliance on or regarding this information. If you have received this fax in error, please notify us immediately by telephone so that we can arrange for its return to [place]. Phone: [phone], Toll-Free: [phone], Fax: [phone] Page: 2 of 2 Call Id: [id number] Paging DoctorName Phone DateTime Action Result/Outcome Notes Lezlie Octave - MD [phone] 05/23/2016 5:40:56 PM Doctor Paged Paged On Call Back to Call Center Please call Quentin Angst @ the call center [phone] Lezlie Octave - MD 05/23/2016 5:56:12 PM Message Result Spoke with On Call -  General Dr Beverely Low informed of criticla  lab report. States she will call caller.

## 2016-05-25 NOTE — Telephone Encounter (Signed)
Great job! Thank you

## 2016-05-25 NOTE — Telephone Encounter (Signed)
SW patient to determine if she followed up at Emergency Department as advised. Patient did not go to the Emergency Department. Patient states the fire department checked her home for Carbon Monoxide, which she states was negative. Patient includes that 2 weeks prior to her labs being obtained, she was using a Kerosene heater which she has stopped using. Patient hesitant to go to the Emergency Room or providers office, stating she cannot leave her dog at home alone. Patient states she feels "loopy" but does not want to be evaluated. Stressed the importance of being evaluated, patient will call if she feels she needs an appointment.

## 2016-05-26 ENCOUNTER — Other Ambulatory Visit: Payer: Self-pay | Admitting: Family Medicine

## 2016-05-26 ENCOUNTER — Telehealth: Payer: Self-pay | Admitting: Family Medicine

## 2016-05-26 DIAGNOSIS — T5891XA Toxic effect of carbon monoxide from unspecified source, accidental (unintentional), initial encounter: Secondary | ICD-10-CM

## 2016-05-26 NOTE — Telephone Encounter (Signed)
SW pt and advised her of lab results.

## 2016-05-26 NOTE — Telephone Encounter (Signed)
Patient states she is returning call to [facility].  Advised patient Herbert Seta was unavailable as she was on another phone call.  Please return call to patient at [phone].

## 2016-06-30 ENCOUNTER — Other Ambulatory Visit: Payer: Self-pay | Admitting: Family Medicine

## 2016-07-01 NOTE — Telephone Encounter (Signed)
Rx faxed to [facility].

## 2016-07-17 ENCOUNTER — Telehealth: Payer: Self-pay

## 2016-07-17 NOTE — Telephone Encounter (Signed)
Noted. Agree.

## 2016-07-17 NOTE — Telephone Encounter (Signed)
Spoke with patient regarding imbalance issues. Patient reports 2 falls in the last 10 days due to "problems with my balance". Denies headache, vision changes or slurred speech. Advised patient to go to the Emergency Department if she experiences these symptoms, patient verbalized understanding. Patient is scheduled to see PCP on Monday, 07/20/2016.

## 2016-07-20 ENCOUNTER — Ambulatory Visit: Payer: Medicare Other | Admitting: Family Medicine

## 2016-07-20 ENCOUNTER — Other Ambulatory Visit: Payer: Self-pay | Admitting: Family Medicine

## 2016-07-20 ENCOUNTER — Telehealth: Payer: Self-pay | Admitting: Family Medicine

## 2016-07-20 DIAGNOSIS — G894 Chronic pain syndrome: Secondary | ICD-10-CM

## 2016-07-20 NOTE — Telephone Encounter (Signed)
Rx printed.  Pt needs f/u in 1 month to f/u chronic pain.-thx

## 2016-07-20 NOTE — Progress Notes (Deleted)
OFFICE VISIT  07/20/2016   CC: No chief complaint on file.  HPI:    Patient is a 71 y.o. 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  Past Medical History:  Diagnosis Date  . Abnormal mammogram 09/2014   left (calcifications): repeat diagnostic L mammogram 6 mo ok.  Repeat mammo 10/09/15 also stable.  . Breast nodule 2008   Multiple mammos and u/s's done to follow this nonpalpable nodule on right breast: pt reports it eventually resolved.  Screening mammo neg/nl 01/2009.  Marland Kitchen Chronic left shoulder pain    Impingement syndrome: Ortho at [facility] did MRI and recommended surgery and this was done 2016.  Marland Kitchen Chronic low back pain   . Chronic neck pain   . COPD (chronic obstructive pulmonary disease) (HCC)   . DDD (degenerative disc disease), lumbar    MRI 05/2014.  Pt declined specialist referral AND said she could not afford PT.  . Family history of abdominal aortic aneurysm    sister.  Screening u/s 09/2014 NEG.  Marland Kitchen Herpes zoster 2011  . History of hypokalemia    on HCTZ.  KDUR supplement started 09/20/2014  . Hyperlipidemia 07/2015   simvastatin started 07/2015; pt had fatigue on this med so self d/c'd it 12/2015 and felt better.  . Hypertension   . IFG (impaired fasting glucose) 07/2015   HbA1c 5.7%  . Multinodular goiter 2006; 2016   Biopsy negative per pt report.  U/s showed multinodular goiter-euthyroid.  Thyroid uptake scan nl except dominant R sided cold nodule (bx neg).  09/2014: RAI uptake and scan NEG, thyroid u/s with minimal change.  10/11/15 repeat u/s again showed minimal change--NO F/U imaging is required anymore.  . Osteoporosis 09/2014  . Polycythemia, secondary    presumed secondary to chronic high volume cigarette smoking  . Pulmonary nodule 2016 &2017   3 mo f/u LTD CT chest nodule f/u w/out contrast done 01/14/16 and showed nodule RESOLVING.  Plan repeat screening noncontrast CT chest 1 year.  . Tobacco abuse     80+ pack-yr hx.  CT screening for lung ca 09/2014 showed multiple nodules <41mm; f/u CT 01/2016 stable.  Repeat 1 yr.  . Wrist fracture, closed 2012   right    Past Surgical History:  Procedure Laterality Date  . ABDOMINAL HYSTERECTOMY  approx 1997   Fibroids (ovaries ARE still in)  . HEMORRHOID SURGERY  approx 1982  . RAI THYROID UPTAKE&SCAN  09/12/14   NO dominant hot or cold nodule--diffuse uptake and gland enlargement c/w multinodular goiter.  Marland Kitchen SHOULDER SURGERY Left 10/2014   RC and bone spurs: pt refused post-op rehab and continued to smoke    Outpatient Medications Prior to Visit  Medication Sig Dispense Refill  . alendronate (FOSAMAX) 70 MG tablet TAKE 1 TAB BY MOUTH EVERY 7 DAYS. TAKE WITH A FULL GLASS OF WATER ON AN EMPTY STOMACH AND REMAIN SITTING UPRIGHT FOR 30 MINUTES AFTER TAKING 4 tablet 12  . aspirin EC 81 MG tablet Take 81 mg by mouth daily.    Marland Kitchen CALCIUM CARBONATE PO Take by mouth.    . diazepam (VALIUM) 10 MG tablet TAKE ONE TABLET BY MOUTH EVERY 12 HOURS AS NEEDED FOR ANXIETY 60 tablet 5  . DULoxetine (CYMBALTA) 30 MG capsule Take 1 capsule (30 mg total) by mouth daily. 30 capsule 6  . hydrochlorothiazide (HYDRODIURIL) 25 MG tablet TAKE ONE TABLET BY MOUTH DAILY 30 tablet 6  . Multiple Vitamin (MULTIVITAMIN)  tablet Take 1 tablet by mouth daily.      . Omega-3 Fatty Acids (FISH OIL) 1200 MG CAPS Take by mouth daily.      Marland Kitchen. oxyCODONE (ROXICODONE) 15 MG immediate release tablet 1 tab po qid prn 120 tablet 0  . potassium chloride SA (K-DUR,KLOR-CON) 20 MEQ tablet 2 tabs po qd 60 tablet 12  . PROAIR HFA 108 (90 Base) MCG/ACT inhaler     . promethazine (PHENERGAN) 12.5 MG tablet Take 1 tablet (12.5 mg total) by mouth every 6 (six) hours as needed for nausea or vomiting. 30 tablet 6  . QUEtiapine (SEROQUEL) 50 MG tablet TAKE ONE TABLET BY MOUTH AT BEDTIME 30 tablet 3  . ranitidine (ZANTAC) 150 MG tablet Take 1 tablet (150 mg total) by mouth 2 (two) times daily. 60 tablet 12    No facility-administered medications prior to visit.     Allergies  Allergen Reactions  . Depo-Medrol [Methylprednisolone] Other (See Comments)    Migraine  . Simvastatin Other (See Comments)    Fatigue    ROS As per HPI  PE: There were no vitals taken for this visit. ***  LABS:  ***  IMPRESSION AND PLAN:  No problem-specific Assessment & Plan notes found for this encounter.   FOLLOW UP: No Follow-up on file.

## 2016-07-20 NOTE — Telephone Encounter (Signed)
Spoke with patient regarding symptoms. Patient reports falling 5 times today, and is unable to come to her appointment because she cannot see. Patient has slurred, slow speech during conversation. Advised patient to go to the Emergency Department for evaluation. Patient advised to call EMS if she cannot find transportation. Patient verbalized understanding, plans to go to [facility].

## 2016-07-20 NOTE — Telephone Encounter (Signed)
Patient calling to states she needs to cancel her 3pm appointment she has scheduled with Dr. Milinda CaveMcGowen today.  She states she is unable to drive to office due to her not seeing well and also because she has fallen 5 times today and she hit her head and now has a headache.  Patient was transferred to triage due to symptoms associated with falls today.

## 2016-07-20 NOTE — Telephone Encounter (Signed)
[facility] been a hour since pt has called. I called pt to see if she was going to go to ER. Pt stated that she could not afford to go. Pt did not sound well on the phone, it took her longer than normal to respond to my questions. I had Selena BattenKim speak with pt.

## 2016-07-20 NOTE — Telephone Encounter (Signed)
Noted agree

## 2016-07-20 NOTE — Telephone Encounter (Signed)
Patient also asked if pcp would refill her prescription for oxyCODONE (ROXICODONE) 15 MG immediate release tablet.  She will have her sister stop by and pick it up if pcp approves refill.

## 2016-07-20 NOTE — Telephone Encounter (Signed)
RF request for oxycodone LOV: 05/21/16 Next ov: None Last written: 05/21/16 #120 w/ 0RF  Please advise. Thanks.

## 2016-07-21 ENCOUNTER — Encounter: Payer: Self-pay | Admitting: Family Medicine

## 2016-07-21 ENCOUNTER — Ambulatory Visit (INDEPENDENT_AMBULATORY_CARE_PROVIDER_SITE_OTHER): Payer: Medicare Other | Admitting: Family Medicine

## 2016-07-21 VITALS — BP 158/81 | HR 82 | Temp 97.9°F | Resp 16 | Ht 64.75 in | Wt 104.5 lb

## 2016-07-21 DIAGNOSIS — F17200 Nicotine dependence, unspecified, uncomplicated: Secondary | ICD-10-CM | POA: Diagnosis not present

## 2016-07-21 DIAGNOSIS — D751 Secondary polycythemia: Secondary | ICD-10-CM

## 2016-07-21 DIAGNOSIS — G894 Chronic pain syndrome: Secondary | ICD-10-CM

## 2016-07-21 DIAGNOSIS — R531 Weakness: Secondary | ICD-10-CM | POA: Diagnosis not present

## 2016-07-21 DIAGNOSIS — I10 Essential (primary) hypertension: Secondary | ICD-10-CM

## 2016-07-21 DIAGNOSIS — R296 Repeated falls: Secondary | ICD-10-CM | POA: Diagnosis not present

## 2016-07-21 DIAGNOSIS — R002 Palpitations: Secondary | ICD-10-CM

## 2016-07-21 DIAGNOSIS — R42 Dizziness and giddiness: Secondary | ICD-10-CM

## 2016-07-21 DIAGNOSIS — R270 Ataxia, unspecified: Secondary | ICD-10-CM | POA: Diagnosis not present

## 2016-07-21 NOTE — Telephone Encounter (Signed)
Pt was seen in office today and was given Rx at her visit.

## 2016-07-21 NOTE — Patient Instructions (Signed)
Go to ED at [facility].

## 2016-07-21 NOTE — Progress Notes (Signed)
OFFICE VISIT  07/21/2016   CC:  Chief Complaint  Patient presents with  . Dizziness    balance issues  . Fall    had 5 falls yesterday morning   HPI:    Patient is a 71 y.o. Caucasian female who presents for balance problems and recurrent falls at home.  She lives alone, but is accompanied by her sister today, who has been helping her out some at home lately. Denies dizziness, lightheadedness, or vertigo: just says she just goes down backwards w/out any warning.  Has to hold on to something to walk around house w/out falling.  No syncope/LOC.  Just hurts various areas in shoulders, neck, and back since falling some.  After falling down she has to crawl to grab onto something to get herself up. She feels diffuse weakness in both legs.  She has been using a cane recently. She has been having this type of problem for about 2 weeks but just got severe yesterday morning. Denies focal weakness but says "I still got that tremor".  No loss of bowel/bladder control.  Denies paresthesias, facial weakness/drooping, or difficulty speaking or swallowing. She called our office a few times yesterday and the nurse-line appropriately triaged her to the ED/call 911 BUT the patient chose not to do this.  She says EMS came to her home and did an evaluation and wanted to transport her to hosp but she refused b/c they would not transport her to [facility] hospital---per pt report today.  ROS: no n/v/d or fever.  One loose BM yesterday.  Has had poor appetite/PO intake for last 6+ months, gradual wt loss. Had a HA a little while recently after she bumped her head when she fell.  Denies blurry or double vision or loss of visual fields.   Past Medical History:  Diagnosis Date  . Abnormal mammogram 09/2014   left (calcifications): repeat diagnostic L mammogram 6 mo ok.  Repeat mammo 10/09/15 also stable.  . Breast nodule 2008   Multiple mammos and u/s's done to follow this nonpalpable nodule on right  breast: pt reports it eventually resolved.  Screening mammo neg/nl 01/2009.  Marland Kitchen Chronic left shoulder pain    Impingement syndrome: Ortho at [facility] did MRI and recommended surgery and this was done 2016.  Marland Kitchen Chronic low back pain   . Chronic neck pain   . COPD (chronic obstructive pulmonary disease) (HCC)   . DDD (degenerative disc disease), lumbar    MRI 05/2014.  Pt declined specialist referral AND said she could not afford PT.  . Family history of abdominal aortic aneurysm    sister.  Screening u/s 09/2014 NEG.  Marland Kitchen Herpes zoster 2011  . History of hypokalemia    on HCTZ.  KDUR supplement started 09/20/2014  . Hyperlipidemia 07/2015   simvastatin started 07/2015; pt had fatigue on this med so self d/c'd it 12/2015 and felt better.  . Hypertension   . IFG (impaired fasting glucose) 07/2015   HbA1c 5.7%  . Multinodular goiter 2006; 2016   Biopsy negative per pt report.  U/s showed multinodular goiter-euthyroid.  Thyroid uptake scan nl except dominant R sided cold nodule (bx neg).  09/2014: RAI uptake and scan NEG, thyroid u/s with minimal change.  10/11/15 repeat u/s again showed minimal change--NO F/U imaging is required anymore.  . Osteoporosis 09/2014  . Polycythemia, secondary    presumed secondary to chronic high volume cigarette smoking  . Pulmonary nodule 2016 &2017   3 mo f/u  LTD CT chest nodule f/u w/out contrast done 01/14/16 and showed nodule RESOLVING.  Plan repeat screening noncontrast CT chest 1 year.  . Tobacco abuse    80+ pack-yr hx.  CT screening for lung ca 09/2014 showed multiple nodules <81mm; f/u CT 01/2016 stable.  Repeat 1 yr.  . Wrist fracture, closed 2012   right    Past Surgical History:  Procedure Laterality Date  . ABDOMINAL HYSTERECTOMY  approx 1997   Fibroids (ovaries ARE still in)  . HEMORRHOID SURGERY  approx 1982  . RAI THYROID UPTAKE&SCAN  09/12/14   NO dominant hot or cold nodule--diffuse uptake and gland enlargement c/w multinodular goiter.  Marland Kitchen SHOULDER SURGERY  Left 10/2014   RC and bone spurs: pt refused post-op rehab and continued to smoke    Outpatient Medications Prior to Visit  Medication Sig Dispense Refill  . alendronate (FOSAMAX) 70 MG tablet TAKE 1 TAB BY MOUTH EVERY 7 DAYS. TAKE WITH A FULL GLASS OF WATER ON AN EMPTY STOMACH AND REMAIN SITTING UPRIGHT FOR 30 MINUTES AFTER TAKING 4 tablet 12  . aspirin EC 81 MG tablet Take 81 mg by mouth daily.    Marland Kitchen CALCIUM CARBONATE PO Take by mouth.    . diazepam (VALIUM) 10 MG tablet TAKE ONE TABLET BY MOUTH EVERY 12 HOURS AS NEEDED FOR ANXIETY 60 tablet 5  . DULoxetine (CYMBALTA) 30 MG capsule Take 1 capsule (30 mg total) by mouth daily. 30 capsule 6  . Multiple Vitamin (MULTIVITAMIN) tablet Take 1 tablet by mouth daily.      . Omega-3 Fatty Acids (FISH OIL) 1200 MG CAPS Take by mouth daily.      Marland Kitchen oxyCODONE (ROXICODONE) 15 MG immediate release tablet TAKE ONE TABLET BY MOUTH FOUR (4) TIMES DAILY AS NEEDED 120 tablet 0  . potassium chloride SA (K-DUR,KLOR-CON) 20 MEQ tablet 2 tabs po qd 60 tablet 12  . PROAIR HFA 108 (90 Base) MCG/ACT inhaler     . promethazine (PHENERGAN) 12.5 MG tablet Take 1 tablet (12.5 mg total) by mouth every 6 (six) hours as needed for nausea or vomiting. 30 tablet 6  . ranitidine (ZANTAC) 150 MG tablet Take 1 tablet (150 mg total) by mouth 2 (two) times daily. 60 tablet 12  . QUEtiapine (SEROQUEL) 50 MG tablet TAKE ONE TABLET BY MOUTH AT BEDTIME 30 tablet 3  . hydrochlorothiazide (HYDRODIURIL) 25 MG tablet TAKE ONE TABLET BY MOUTH DAILY (Patient not taking: Reported on 07/21/2016) 30 tablet 6   No facility-administered medications prior to visit.     Allergies  Allergen Reactions  . Depo-Medrol [Methylprednisolone] Other (See Comments)    Migraine  . Simvastatin Other (See Comments)    Fatigue    ROS As per HPI  PE: Blood pressure (!) 158/81, pulse 82, temperature 97.9 F (36.6 C), temperature source Oral, resp. rate 16, height 5' 4.75" (1.645 m), weight 104 lb 8 oz  (47.4 kg), SpO2 96 %. Orthostatics: supine 161/90, 68.  Upright 155/82, 71.  Standing 138/90, 81.  Gen: alert, chronically ill-appearing and unkempt.  Strong smell of tobacco.  Oriented to person, place, time, situation. AFFECT: frustrated/anxious, but fairly pleasant, with lucid thought and speech. ENT: Ears: EAC on R clear, normal epithelium and TM.  Left EAC with 100% cerumen impaction.   Eyes: no injection, icteris, swelling, or exudate.  EOMI, PERRLA.  Visual field testing showed no deficits. Nose: no drainage or turbinate edema/swelling.  No injection or focal lesion.  Mouth: lips without lesion/swelling.  Oral  mucosa pink and tachy, tongue with brown/grey film.    Oropharynx without erythema, exudate, or swelling.  Neck - No masses or thyromegaly or limitation in range of motion CV: RRR, no m/r/g.   LUNGS: CTA bilat, nonlabored resps, good aeration in all lung fields. ABD: soft, NT/ND EXT: no clubbing, cyanosis, or edema.  Neuro: CN 2-12 intact bilaterally, strength 5-/5 in proximal and distal upper extremities and lower extremities bilaterally.  No sensory deficits.  No tremor.  FNF normal bilat.  She is unable to ambulate w/out a cane, and even with the cane she requires a stabilizing hand of her sister today.  Upper extremity and lower extremity DTRs symmetric.  No pronator drift.   LABS:  Lab Results  Component Value Date   TSH 0.43 04/24/2016   Lab Results  Component Value Date   WBC 7.6 05/21/2016   HGB 16.4 (H) 05/21/2016   HCT 48.2 (H) 05/21/2016   MCV 90.1 05/21/2016   PLT 288 05/21/2016   Lab Results  Component Value Date   CREATININE 0.76 04/24/2016   BUN 5 (L) 04/24/2016   NA 134 (L) 04/24/2016   K 3.8 04/24/2016   CL 97 04/24/2016   CO2 31 04/24/2016   Lab Results  Component Value Date   ALT 17 04/24/2016   AST 13 04/24/2016   ALKPHOS 85 04/24/2016   BILITOT 0.5 04/24/2016   Lab Results  Component Value Date   CHOL 231 (H) 08/01/2015   Lab Results   Component Value Date   HDL 47.20 08/01/2015   Lab Results  Component Value Date   LDLCALC 151 (H) 08/01/2015   Lab Results  Component Value Date   TRIG 168.0 (H) 08/01/2015   Lab Results  Component Value Date   CHOLHDL 5 08/01/2015   Lab Results  Component Value Date   HGBA1C 5.7 08/02/2015   12 lead EKG today: NSR, rate 68, poor R wave progression, TWI V1-V3.  No ST segment elevation or depression.  Low voltage limb leads.  No ectopy.  Normal intervals and duration.  No prior EKG for comparison.  IMPRESSION AND PLAN:  1) Recurrent falls, question of ataxia vs generalized weakness. She is not safe going home and having me start an outpt w/u---I told her she needed to be evaluated in hospital and then have the best environment for d/c home determined with help of case manager and her MDs there. Given her wt loss, our w/u has been unrevealing, and the next step I was going to pursue was GI referral for consideration of upper and lower endoscopy. With her ataxia/falls, neuroimaging is needed---likely a noncontrast CT first, then if needed proceed to MRI brain. Of note, some of this MAY be connected to recently starting seroquel so I'll stop this med (but I would not expect such extreme symptoms from such a low dose).  2) Chronic pain syndrome: I did hand pt a rx today for her oxycodone 15mg , 1 qid prn, #120.  3) HTN: we had d/c'd her hctz at last visit 2 mo ago b/c bps fine and she was losing wt: wanted to avoid hypotension. We'll continue off this med for now as well.  An After Visit Summary was printed and given to the patient.  FOLLOW UP: Return to be determined based on hospitalization.  Signed:  Santiago BumpersPhil Matai Carpenito, MD           07/21/2016

## 2016-07-25 DIAGNOSIS — R404 Transient alteration of awareness: Secondary | ICD-10-CM | POA: Diagnosis not present

## 2016-07-27 ENCOUNTER — Telehealth: Payer: Self-pay | Admitting: *Deleted

## 2016-07-27 NOTE — Telephone Encounter (Signed)
Noted. Awaiting records from [facility] for details re: death. Otherwise I am unable to fill out death certificate.

## 2016-07-27 NOTE — Telephone Encounter (Signed)
Certificate of Death has been placed on Dr. Verdis FredericksonMcGowens desk to be completed. Please call [phone] once signed.

## 2016-07-27 NOTE — Telephone Encounter (Addendum)
Spoke to patient's sister, Mckenzie Peters and she said that patient refused to go to hospital after leaving here.   They went to get meds and then patient "threw a fit" and she stated she would not go to hospital.  Mckenzie Peters states she called her everyday but last talked to her Wednesday.  After not being able to reach her Thursday, Friday, and Saturday the sister called police to do a "well check" and that's when they found patient lying on the floor, EMS at the time said that possible heart attack or perhaps patient fell and hit her head.  Other than that, there was no other news of what happened to patient.    If needed, this is patient's sisters cell Mckenzie Peters- [phone]

## 2016-07-29 NOTE — Telephone Encounter (Signed)
Noted. Death certificate completed.

## 2016-08-09 DIAGNOSIS — 419620001 Death: Secondary | SNOMED CT | POA: Diagnosis not present

## 2016-08-09 DEATH — deceased
